# Patient Record
Sex: Male | Born: 1945 | Race: White | Hispanic: No | Marital: Married | Smoking: Former smoker
Health system: Southern US, Community
[De-identification: ages and names within clinical notes are randomized; demographics above are authoritative.]

## PROBLEM LIST (undated history)

## (undated) DIAGNOSIS — K2970 Gastritis, unspecified, without bleeding: Secondary | ICD-10-CM

## (undated) DIAGNOSIS — Z809 Family history of malignant neoplasm, unspecified: Secondary | ICD-10-CM

## (undated) DIAGNOSIS — Q661 Congenital talipes calcaneovarus, unspecified foot: Secondary | ICD-10-CM

## (undated) DIAGNOSIS — A809 Acute poliomyelitis, unspecified: Secondary | ICD-10-CM

## (undated) DIAGNOSIS — G8929 Other chronic pain: Secondary | ICD-10-CM

## (undated) DIAGNOSIS — I10 Essential (primary) hypertension: Secondary | ICD-10-CM

## (undated) DIAGNOSIS — Z8719 Personal history of other diseases of the digestive system: Secondary | ICD-10-CM

## (undated) DIAGNOSIS — M1990 Unspecified osteoarthritis, unspecified site: Secondary | ICD-10-CM

## (undated) DIAGNOSIS — M1612 Unilateral primary osteoarthritis, left hip: Secondary | ICD-10-CM

## (undated) DIAGNOSIS — E039 Hypothyroidism, unspecified: Secondary | ICD-10-CM

## (undated) DIAGNOSIS — Z808 Family history of malignant neoplasm of other organs or systems: Secondary | ICD-10-CM

## (undated) DIAGNOSIS — R7303 Prediabetes: Secondary | ICD-10-CM

## (undated) DIAGNOSIS — K900 Celiac disease: Secondary | ICD-10-CM

## (undated) DIAGNOSIS — Z801 Family history of malignant neoplasm of trachea, bronchus and lung: Secondary | ICD-10-CM

## (undated) DIAGNOSIS — Z860100 Personal history of colon polyps, unspecified: Secondary | ICD-10-CM

## (undated) DIAGNOSIS — M7750 Other enthesopathy of unspecified foot: Secondary | ICD-10-CM

## (undated) DIAGNOSIS — E785 Hyperlipidemia, unspecified: Secondary | ICD-10-CM

## (undated) DIAGNOSIS — K635 Polyp of colon: Secondary | ICD-10-CM

## (undated) HISTORY — DX: Family history of malignant neoplasm of other organs or systems: Z80.8

## (undated) HISTORY — PX: OTHER SURGICAL HISTORY: SHX169

## (undated) HISTORY — DX: Personal history of colonic polyps: Z86.010

## (undated) HISTORY — PX: SHOULDER ARTHROSCOPY: SHX128

## (undated) HISTORY — DX: Personal history of colon polyps, unspecified: Z86.0100

## (undated) HISTORY — PX: ANKLE ARTHROCENTESIS: SUR45

## (undated) HISTORY — DX: Family history of malignant neoplasm, unspecified: Z80.9

## (undated) HISTORY — DX: Family history of malignant neoplasm of trachea, bronchus and lung: Z80.1

## (undated) HISTORY — PX: KNEE ARTHROSCOPY: SHX127

---
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
## 1965-05-28 DIAGNOSIS — R569 Unspecified convulsions: Secondary | ICD-10-CM

## 1965-05-28 HISTORY — DX: Unspecified convulsions: R56.9

## 1968-05-28 HISTORY — PX: ANKLE ARTHROCENTESIS: SUR45

## 1970-05-28 HISTORY — PX: SHOULDER ARTHROSCOPY: SHX128

## 1976-05-28 HISTORY — PX: KNEE ARTHROSCOPY: SUR90

## 1990-05-28 HISTORY — PX: KNEE ARTHROSCOPY: SHX127

## 2005-01-09 ENCOUNTER — Emergency Department: Payer: Self-pay | Admitting: Emergency Medicine

## 2008-07-13 ENCOUNTER — Ambulatory Visit: Payer: Self-pay | Admitting: Gastroenterology

## 2008-07-14 ENCOUNTER — Inpatient Hospital Stay: Payer: Self-pay | Admitting: Gastroenterology

## 2008-11-11 ENCOUNTER — Ambulatory Visit: Payer: Self-pay | Admitting: Endocrinology

## 2008-12-02 ENCOUNTER — Encounter: Payer: Self-pay | Admitting: Endocrinology

## 2009-02-11 ENCOUNTER — Ambulatory Visit: Payer: Self-pay | Admitting: Gastroenterology

## 2010-03-08 ENCOUNTER — Ambulatory Visit: Payer: Self-pay | Admitting: Endocrinology

## 2010-05-01 ENCOUNTER — Ambulatory Visit: Payer: Self-pay | Admitting: Gastroenterology

## 2011-10-01 ENCOUNTER — Ambulatory Visit: Payer: Self-pay | Admitting: Gastroenterology

## 2015-08-15 ENCOUNTER — Other Ambulatory Visit: Payer: Self-pay | Admitting: Orthopedic Surgery

## 2015-08-15 DIAGNOSIS — M1712 Unilateral primary osteoarthritis, left knee: Secondary | ICD-10-CM

## 2015-08-19 ENCOUNTER — Ambulatory Visit
Admission: RE | Admit: 2015-08-19 | Discharge: 2015-08-19 | Disposition: A | Discharge status: Home / Self Care | Payer: Federal, State, Local not specified - PPO | Source: Ambulatory Visit | Attending: Orthopedic Surgery | Admitting: Orthopedic Surgery

## 2015-08-19 DIAGNOSIS — M1712 Unilateral primary osteoarthritis, left knee: Secondary | ICD-10-CM

## 2015-08-31 ENCOUNTER — Encounter
Admission: RE | Admit: 2015-08-31 | Discharge: 2015-08-31 | Disposition: A | Discharge status: Home / Self Care | Payer: Federal, State, Local not specified - PPO | Source: Ambulatory Visit | Attending: Orthopedic Surgery | Admitting: Orthopedic Surgery

## 2015-08-31 DIAGNOSIS — Z01812 Encounter for preprocedural laboratory examination: Secondary | ICD-10-CM | POA: Insufficient documentation

## 2015-08-31 HISTORY — DX: Unspecified osteoarthritis, unspecified site: M19.90

## 2015-08-31 HISTORY — DX: Celiac disease: K90.0

## 2015-08-31 HISTORY — DX: Essential (primary) hypertension: I10

## 2015-08-31 HISTORY — DX: Acute poliomyelitis, unspecified: A80.9

## 2015-08-31 LAB — BASIC METABOLIC PANEL
ANION GAP: 5 (ref 5–15)
BUN: 19 mg/dL (ref 6–20)
CO2: 25 mmol/L (ref 22–32)
Calcium: 9 mg/dL (ref 8.9–10.3)
Chloride: 103 mmol/L (ref 101–111)
Creatinine, Ser: 0.71 mg/dL (ref 0.61–1.24)
GLUCOSE: 89 mg/dL (ref 65–99)
Potassium: 4.1 mmol/L (ref 3.5–5.1)
SODIUM: 133 mmol/L — AB (ref 135–145)

## 2015-08-31 LAB — URINALYSIS COMPLETE WITH MICROSCOPIC (ARMC ONLY)
BACTERIA UA: NONE SEEN
Bilirubin Urine: NEGATIVE
Glucose, UA: NEGATIVE mg/dL
Ketones, ur: NEGATIVE mg/dL
LEUKOCYTES UA: NEGATIVE
Nitrite: NEGATIVE
PH: 6 (ref 5.0–8.0)
PROTEIN: NEGATIVE mg/dL
SQUAMOUS EPITHELIAL / LPF: NONE SEEN
Specific Gravity, Urine: 1.015 (ref 1.005–1.030)

## 2015-08-31 LAB — PROTIME-INR
INR: 1.08
PROTHROMBIN TIME: 14.2 s (ref 11.4–15.0)

## 2015-08-31 LAB — SURGICAL PCR SCREEN
MRSA, PCR: NEGATIVE
Staphylococcus aureus: NEGATIVE

## 2015-08-31 LAB — CBC
HCT: 43.6 % (ref 40.0–52.0)
HEMOGLOBIN: 14.6 g/dL (ref 13.0–18.0)
MCH: 31.5 pg (ref 26.0–34.0)
MCHC: 33.5 g/dL (ref 32.0–36.0)
MCV: 94.1 fL (ref 80.0–100.0)
Platelets: 177 10*3/uL (ref 150–440)
RBC: 4.63 MIL/uL (ref 4.40–5.90)
RDW: 14.1 % (ref 11.5–14.5)
WBC: 5.3 10*3/uL (ref 3.8–10.6)

## 2015-08-31 LAB — SEDIMENTATION RATE: SED RATE: 6 mm/h (ref 0–20)

## 2015-08-31 LAB — APTT: APTT: 29 s (ref 24–36)

## 2015-08-31 NOTE — Patient Instructions (Signed)
  Your procedure is scheduled on: 09/15/15 Thurs Report to Day Surgery.2nd floor medical mall  To find out your arrival time please call [phone] between 1PM - 3PM on 09/14/15 Wed.  Remember: Instructions that are not followed completely may result in serious medical risk, up to and including death, or upon the discretion of your surgeon and anesthesiologist your surgery may need to be rescheduled.    _x___ 1. Do not eat food or drink liquids after midnight. No gum chewing or hard candies.     ____ 2. No Alcohol for 24 hours before or after surgery.   ____ 3. Bring all medications with you on the day of surgery if instructed.    _x___ 4. Notify your doctor if there is any change in your medical condition     (cold, fever, infections).     Do not wear jewelry, make-up, hairpins, clips or nail polish.  Do not wear lotions, powders, or perfumes. You may wear deodorant.  Do not shave 48 hours prior to surgery. Men may shave face and neck.  Do not bring valuables to the hospital.    [facility] is not responsible for any belongings or valuables.               Contacts, dentures or bridgework may not be worn into surgery.  Leave your suitcase in the car. After surgery it may be brought to your room.  For patients admitted to the hospital, discharge time is determined by your                treatment team.   Patients discharged the day of surgery will not be allowed to drive home.   Please read over the following fact sheets that you were given:   MRSA Information   _x___ Take these medicines the morning of surgery with A SIP OF WATER:    1. levothyroxine (SYNTHROID, LEVOTHROID) 100 MCG tablet  2.   3.   4.  5.  6.  ____ Fleet Enema (as directed)   _x___ Use CHG Soap as directed  ____ Use inhalers on the day of surgery  ____ Stop metformin 2 days prior to surgery    ____ Take 1/2 of usual insulin dose the night before surgery and none on the morning of surgery.   __x__  Stop Coumadin/Plavix/aspirin on 1 week to 10 days before surgery  __x__ Stop Anti-inflammatories on 1 week before surgery. May take Tylenol as needed   ____ Stop supplements until after surgery.    ____ Bring C-Pap to the hospital.

## 2015-08-31 NOTE — Pre-Procedure Instructions (Signed)
Curtis Carroll, [place] - 08/30/2015 2:15 PM EDT Formatting of this note may be different from the original. Chief Complaint: Chief Complaint  Patient presents with  . Knee Pain  H&P left knee 09/15/15   Curtis Carroll is a 70 y.o. male who presents today for evaluation of chronic left knee pain. He has had knee pain for 20 years. He has had 2 arthroscopy procedures at [facility]. Pain has been progressively getting worse over the last few years. He describes a constant ache and pain along the medial joint line of the left knee that is increased with activity. By the end of the day his knee will be swollen and his range of motion is limited. Occasionally knee will buckle and give out. He has pain underneath the patella pain with stairs. He gets relief with using a cane wrapping the knee. He does not tolerate NSAIDs well. Patient's pain is 3 out of 10, will be moderate to severe by the end of the day. Patient works for the [employer], pain is interfering with his activities of daily living and work. 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 He denies any sensation of loose body moving throughout the knee.  Past Medical History: Past Medical History  Diagnosis Date  . Hyperlipidemia, unspecified  . Hypertension  . Polio   Past Surgical History: Past Surgical History  Procedure Laterality Date  . Knee surgery Left  . Ankle surgery Right  . Fusion arthrodesis ankle w/arthroscopy   Past Family History: Family History  Problem Relation Age of Onset  . Heart disease Father  . Lung cancer Brother   Medications: Current Outpatient Prescriptions Ordered in Epic  Medication Sig Dispense Refill  . aspirin 81 MG EC tablet Take 81 mg by mouth once daily.  Marland Kitchen atorvastatin (LIPITOR) 10 MG tablet Take 5 mg by mouth once daily.  Marland Kitchen FOLIC ACID/MULTIVIT-MIN/LUTEIN (CENTRUM SILVER ORAL) Take by mouth.  Marland Kitchen lisinopril  (PRINIVIL,ZESTRIL) 20 MG tablet Take 20 mg by mouth once daily. 2  . SYNTHROID 100 mcg tablet Take 100 mcg by mouth once daily. 2  . zolpidem (AMBIEN) 5 MG tablet Take 5 mg by mouth nightly as needed for Sleep.   No current Epic-ordered facility-administered medications on file.   Allergies: Allergies  Allergen Reactions  . Alcohol Unknown  . Codeine Nausea  . Milk Unknown  . Other Diarrhea  Black pepper  . Wheat Unknown    Review of Systems:  A comprehensive 14 point ROS was performed, reviewed by me today, and the pertinent orthopaedic findings are documented in the HPI.  Exam: Visit Vitals  . BP 130/78 (BP Location: Left upper arm, Patient Position: Sitting, BP Cuff Size: Adult)  . Ht 180.3 cm (5\' 11" )  . Wt 94.3 kg (208 lb)  . BMI 29.01 kg/m2   General: Well developed, well nourished 70 y.o. male in no apparent distress. Normal affect. Normal communication. Patient answers questions appropriately. Patient ambulates with mild antalgic component. Ambulates with a cane  HEENT: Head normocephalic, atraumatic, PERRL.   Abdomen: Soft, non tender, non distended, Bowel sounds present.  Heart: Examination of the heart reveals regular, rate, and rhythm. There is no murmur noted on ascultation. There is a normal apical pulse.  Lungs: Lungs are clear to auscultation. There is no wheeze, rhonchi, or crackles. There is normal expansion of bilateral chest walls.  Left lower Extremities: Examination of the left  lower extremity reveals no bony abnormality, no edema, mild effusion and no ecchymosis. There is no valgus or varus abnormality. The patient is non-tender along the lateral joint line, and is mildly tender along the medial joint line. The patient has 5-108 range of motion. There is crepitus with range of motion exercises. The patient has a negative rotational Mcmurray test. There is no retropatellar discomfort. The patient has a painful patella stretch test. The patient has a  negative varus stress test and a negative valgus stress test, in looking for stability. The patient has a negative Lachman's test.  Vascular: The patient has a negative Bevelyn Buckles' test bilaterally. The patient had a normal dorsalis pedis and posterior tibial pulse. There is normal skin warmth. There is normal capillary refill bilaterally.   Neurologic: The patient has a negative straight leg raise. The patient has normal muscle strength testing for the quadriceps, calves, ankle dorsiflexion, ankle plantarflexion, and extensor hallicus longus. The patient has sensation that is intact to light touch. The deep tendon reflexes are normal at the patella and achilles. No clonus is noted.   Imaging: AP lateral and sunrise views of the left knee were reviewed by me in the office today. Impression: Patient has moderate to severe joint space narrowing with minimal space on the medial joint line. There is significant spurring along the medial lateral femoral condyle. There is mild chondrocalcinosis of the lateral compartment. There is no evidence of acute bony abnormality. There is moderate patellofemoral spurring. No evidence of loose body.  Impression: Primary osteoarthritis of left knee [M17.12] Primary osteoarthritis of left knee (primary encounter diagnosis)  Plan:  1. Risks, benefits, complications of a left total knee arthroplasty were discussed with the patient. Patient has agreed and consented to the procedure. He has reviewed total knee information handout. Concerns and questions have been addressed. Surgery is scheduled with Dr. Rudene Christians on 09/15/2015.  This note was generated in part with voice recognition software and I apologize for any typographical errors that were not detected and corrected.  Curtis Carroll Gaines MPA-C    Back to top of Progress Notes Lyndle Herrlich, [place] - 08/30/2015 2:15 PM EDT Review of Systems  Constitutional: Negative.  HENT: Negative.  Eyes: Negative.   Respiratory: Negative.  Cardiovascular: Negative.  Gastrointestinal: Negative.  Endocrine: Negative.  Genitourinary: Negative.  Musculoskeletal: Negative.  Skin: Negative.  Allergic/Immunologic: Positive for food allergies.  Neurological: Negative.  Hematological: Negative.  Psychiatric/Behavioral: Negative.

## 2015-09-02 LAB — URINE CULTURE

## 2015-09-03 NOTE — Pre-Procedure Instructions (Signed)
Urine culture sent to Dr. Rudene Christians for review.  Also asked if he wanted it recollected?

## 2015-09-07 ENCOUNTER — Encounter
Admission: RE | Admit: 2015-09-07 | Discharge: 2015-09-07 | Disposition: A | Discharge status: Home / Self Care | Payer: Federal, State, Local not specified - PPO | Source: Ambulatory Visit | Attending: Orthopedic Surgery | Admitting: Orthopedic Surgery

## 2015-09-07 DIAGNOSIS — Z01812 Encounter for preprocedural laboratory examination: Secondary | ICD-10-CM | POA: Diagnosis present

## 2015-09-07 LAB — TYPE AND SCREEN
ABO/RH(D): O POS
ANTIBODY SCREEN: NEGATIVE

## 2015-09-07 LAB — ABO/RH: ABO/RH(D): O POS

## 2015-09-15 ENCOUNTER — Inpatient Hospital Stay
Admission: RE | Admit: 2015-09-15 | Discharge: 2015-09-18 | DRG: 470 | Disposition: A | Discharge status: Skilled Nursing Facility | Payer: Federal, State, Local not specified - PPO | Source: Ambulatory Visit | Attending: Orthopedic Surgery | Admitting: Orthopedic Surgery

## 2015-09-15 ENCOUNTER — Inpatient Hospital Stay: Payer: Federal, State, Local not specified - PPO

## 2015-09-15 ENCOUNTER — Encounter
Admission: RE | Disposition: A | Discharge status: Skilled Nursing Facility | Payer: Self-pay | Source: Ambulatory Visit | Attending: Orthopedic Surgery

## 2015-09-15 ENCOUNTER — Inpatient Hospital Stay: Payer: Federal, State, Local not specified - PPO | Admitting: Anesthesiology

## 2015-09-15 ENCOUNTER — Encounter: Payer: Self-pay | Admitting: *Deleted

## 2015-09-15 DIAGNOSIS — I10 Essential (primary) hypertension: Secondary | ICD-10-CM | POA: Diagnosis present

## 2015-09-15 DIAGNOSIS — Z8612 Personal history of poliomyelitis: Secondary | ICD-10-CM | POA: Diagnosis not present

## 2015-09-15 DIAGNOSIS — X58XXXA Exposure to other specified factors, initial encounter: Secondary | ICD-10-CM | POA: Diagnosis not present

## 2015-09-15 DIAGNOSIS — D62 Acute posthemorrhagic anemia: Secondary | ICD-10-CM | POA: Diagnosis not present

## 2015-09-15 DIAGNOSIS — M1712 Unilateral primary osteoarthritis, left knee: Principal | ICD-10-CM | POA: Diagnosis present

## 2015-09-15 DIAGNOSIS — S0501XA Injury of conjunctiva and corneal abrasion without foreign body, right eye, initial encounter: Secondary | ICD-10-CM | POA: Diagnosis not present

## 2015-09-15 DIAGNOSIS — G8918 Other acute postprocedural pain: Secondary | ICD-10-CM

## 2015-09-15 DIAGNOSIS — Z7982 Long term (current) use of aspirin: Secondary | ICD-10-CM

## 2015-09-15 DIAGNOSIS — G8929 Other chronic pain: Secondary | ICD-10-CM | POA: Diagnosis present

## 2015-09-15 DIAGNOSIS — Z79899 Other long term (current) drug therapy: Secondary | ICD-10-CM | POA: Diagnosis not present

## 2015-09-15 DIAGNOSIS — K900 Celiac disease: Secondary | ICD-10-CM | POA: Diagnosis present

## 2015-09-15 DIAGNOSIS — E785 Hyperlipidemia, unspecified: Secondary | ICD-10-CM | POA: Diagnosis present

## 2015-09-15 DIAGNOSIS — Z801 Family history of malignant neoplasm of trachea, bronchus and lung: Secondary | ICD-10-CM

## 2015-09-15 DIAGNOSIS — Y92234 Operating room of hospital as the place of occurrence of the external cause: Secondary | ICD-10-CM | POA: Diagnosis not present

## 2015-09-15 DIAGNOSIS — Z8249 Family history of ischemic heart disease and other diseases of the circulatory system: Secondary | ICD-10-CM

## 2015-09-15 HISTORY — PX: TOTAL KNEE ARTHROPLASTY: SHX125

## 2015-09-15 LAB — CREATININE, SERUM: Creatinine, Ser: 0.72 mg/dL (ref 0.61–1.24)

## 2015-09-15 LAB — CBC
HEMATOCRIT: 39.5 % — AB (ref 40.0–52.0)
Hemoglobin: 13.3 g/dL (ref 13.0–18.0)
MCH: 31.2 pg (ref 26.0–34.0)
MCHC: 33.6 g/dL (ref 32.0–36.0)
MCV: 92.8 fL (ref 80.0–100.0)
PLATELETS: 142 10*3/uL — AB (ref 150–440)
RBC: 4.26 MIL/uL — ABNORMAL LOW (ref 4.40–5.90)
RDW: 14.4 % (ref 11.5–14.5)
WBC: 4.4 10*3/uL (ref 3.8–10.6)

## 2015-09-15 SURGERY — ARTHROPLASTY, KNEE, TOTAL
Anesthesia: Spinal | Site: Knee | Laterality: Left | Wound class: Clean

## 2015-09-15 MED ORDER — ONDANSETRON HCL 4 MG PO TABS: 4.0000 mg | ORAL_TABLET | Freq: Four times a day (QID) | ORAL | Status: DC | PRN

## 2015-09-15 MED ORDER — TRANEXAMIC ACID 1000 MG/10ML IV SOLN
1000.0000 mg | INTRAVENOUS | Status: DC
  Filled 2015-09-15: qty 10

## 2015-09-15 MED ORDER — METOCLOPRAMIDE HCL 5 MG/ML IJ SOLN: 5.0000 mg | Freq: Three times a day (TID) | INTRAMUSCULAR | Status: DC | PRN

## 2015-09-15 MED ORDER — ATORVASTATIN CALCIUM 10 MG PO TABS
5.0000 mg | ORAL_TABLET | Freq: Every day | ORAL | Status: DC
  Administered 2015-09-15 – 2015-09-17 (×3): 5 mg via ORAL
  Filled 2015-09-15 (×3): qty 1

## 2015-09-15 MED ORDER — DOCUSATE SODIUM 100 MG PO CAPS
100.0000 mg | ORAL_CAPSULE | Freq: Two times a day (BID) | ORAL | Status: DC
  Administered 2015-09-15 – 2015-09-18 (×7): 100 mg via ORAL
  Filled 2015-09-15 (×7): qty 1

## 2015-09-15 MED ORDER — METOCLOPRAMIDE HCL 10 MG PO TABS: 5.0000 mg | ORAL_TABLET | Freq: Three times a day (TID) | ORAL | Status: DC | PRN

## 2015-09-15 MED ORDER — LACTATED RINGERS IV SOLN
INTRAVENOUS | Status: DC | PRN
Start: 2015-09-15 — End: 2015-09-15
  Administered 2015-09-15: 07:00:00 via INTRAVENOUS

## 2015-09-15 MED ORDER — NEOMYCIN-POLYMYXIN B GU 40-200000 IR SOLN
Status: AC
  Filled 2015-09-15: qty 20

## 2015-09-15 MED ORDER — ZOLPIDEM TARTRATE 5 MG PO TABS
5.0000 mg | ORAL_TABLET | Freq: Every evening | ORAL | Status: DC | PRN
  Administered 2015-09-15: 5 mg via ORAL
  Filled 2015-09-15: qty 1

## 2015-09-15 MED ORDER — ONDANSETRON HCL 4 MG/2ML IJ SOLN: 4.0000 mg | Freq: Four times a day (QID) | INTRAMUSCULAR | Status: DC | PRN

## 2015-09-15 MED ORDER — EPHEDRINE SULFATE 50 MG/ML IJ SOLN
INTRAMUSCULAR | Status: DC | PRN
  Administered 2015-09-15: 10 mg via INTRAVENOUS

## 2015-09-15 MED ORDER — ACETAMINOPHEN 650 MG RE SUPP: 650.0000 mg | Freq: Four times a day (QID) | RECTAL | Status: DC | PRN

## 2015-09-15 MED ORDER — ENOXAPARIN SODIUM 30 MG/0.3ML ~~LOC~~ SOLN
30.0000 mg | Freq: Two times a day (BID) | SUBCUTANEOUS | Status: DC
  Administered 2015-09-16 – 2015-09-18 (×5): 30 mg via SUBCUTANEOUS
  Filled 2015-09-15 (×5): qty 0.3

## 2015-09-15 MED ORDER — OXYCODONE HCL 5 MG PO TABS: 5.0000 mg | ORAL_TABLET | Freq: Once | ORAL | Status: DC | PRN

## 2015-09-15 MED ORDER — ONDANSETRON HCL 4 MG/2ML IJ SOLN
INTRAMUSCULAR | Status: DC | PRN
  Administered 2015-09-15: 4 mg via INTRAVENOUS

## 2015-09-15 MED ORDER — CEFAZOLIN SODIUM-DEXTROSE 2-4 GM/100ML-% IV SOLN
INTRAVENOUS | Status: AC
Start: 2015-09-15 — End: 2015-09-15
  Administered 2015-09-15: 2 g via INTRAVENOUS
  Filled 2015-09-15: qty 100

## 2015-09-15 MED ORDER — BUPIVACAINE HCL (PF) 0.5 % IJ SOLN
INTRAMUSCULAR | Status: DC | PRN
  Administered 2015-09-15: 3 mL

## 2015-09-15 MED ORDER — ACETAMINOPHEN 325 MG PO TABS
650.0000 mg | ORAL_TABLET | Freq: Four times a day (QID) | ORAL | Status: DC | PRN
  Filled 2015-09-15: qty 2

## 2015-09-15 MED ORDER — MORPHINE SULFATE (PF) 10 MG/ML IV SOLN
INTRAVENOUS | Status: AC
  Filled 2015-09-15: qty 1

## 2015-09-15 MED ORDER — SODIUM CHLORIDE 0.9 % IV SOLN
INTRAVENOUS | Status: DC | PRN
  Administered 2015-09-15: 60 mL

## 2015-09-15 MED ORDER — TRANEXAMIC ACID 1000 MG/10ML IV SOLN
1000.0000 mg | INTRAVENOUS | Status: DC | PRN
  Administered 2015-09-15: 1000 mg via INTRAVENOUS

## 2015-09-15 MED ORDER — FAMOTIDINE 20 MG PO TABS
20.0000 mg | ORAL_TABLET | Freq: Once | ORAL | Status: AC
  Administered 2015-09-15: 20 mg via ORAL

## 2015-09-15 MED ORDER — BISACODYL 10 MG RE SUPP
10.0000 mg | Freq: Every day | RECTAL | Status: DC | PRN
  Administered 2015-09-18: 10 mg via RECTAL
  Filled 2015-09-15: qty 1

## 2015-09-15 MED ORDER — PROPOFOL 500 MG/50ML IV EMUL
INTRAVENOUS | Status: DC | PRN
  Administered 2015-09-15: 50 ug/kg/min via INTRAVENOUS

## 2015-09-15 MED ORDER — MORPHINE SULFATE (PF) 2 MG/ML IV SOLN: 2.0000 mg | INTRAVENOUS | Status: DC | PRN

## 2015-09-15 MED ORDER — METHOCARBAMOL 500 MG PO TABS: 500.0000 mg | ORAL_TABLET | Freq: Four times a day (QID) | ORAL | Status: DC | PRN

## 2015-09-15 MED ORDER — MAGNESIUM HYDROXIDE 400 MG/5ML PO SUSP
30.0000 mL | Freq: Every day | ORAL | Status: DC | PRN
  Administered 2015-09-16 – 2015-09-17 (×2): 30 mL via ORAL
  Filled 2015-09-15 (×2): qty 30

## 2015-09-15 MED ORDER — LACTATED RINGERS IV SOLN
INTRAVENOUS | Status: DC
  Administered 2015-09-15: 06:00:00 via INTRAVENOUS

## 2015-09-15 MED ORDER — MAGNESIUM CITRATE PO SOLN: 1.0000 | Freq: Once | ORAL | Status: DC | PRN

## 2015-09-15 MED ORDER — MENTHOL 3 MG MT LOZG: 1.0000 | LOZENGE | OROMUCOSAL | Status: DC | PRN

## 2015-09-15 MED ORDER — KETOROLAC TROMETHAMINE 30 MG/ML IJ SOLN
INTRAMUSCULAR | Status: DC | PRN
  Administered 2015-09-15: 30 mg

## 2015-09-15 MED ORDER — SODIUM CHLORIDE 0.9 % IJ SOLN
INTRAMUSCULAR | Status: AC
  Filled 2015-09-15: qty 100

## 2015-09-15 MED ORDER — LEVOTHYROXINE SODIUM 100 MCG PO TABS
100.0000 ug | ORAL_TABLET | Freq: Every day | ORAL | Status: DC
  Administered 2015-09-16 – 2015-09-18 (×3): 100 ug via ORAL
  Filled 2015-09-15 (×3): qty 1

## 2015-09-15 MED ORDER — CLINDAMYCIN PHOSPHATE 900 MG/50ML IV SOLN
900.0000 mg | Freq: Four times a day (QID) | INTRAVENOUS | Status: AC
  Administered 2015-09-15 (×3): 900 mg via INTRAVENOUS
  Filled 2015-09-15 (×3): qty 50

## 2015-09-15 MED ORDER — ADULT MULTIVITAMIN W/MINERALS CH
1.0000 | ORAL_TABLET | Freq: Every day | ORAL | Status: DC
  Administered 2015-09-15: 1 via ORAL
  Filled 2015-09-15 (×3): qty 1

## 2015-09-15 MED ORDER — CEFAZOLIN SODIUM-DEXTROSE 2-4 GM/100ML-% IV SOLN: 2.0000 g | Freq: Once | INTRAVENOUS | Status: DC

## 2015-09-15 MED ORDER — OXYCODONE HCL 5 MG/5ML PO SOLN: 5.0000 mg | Freq: Once | ORAL | Status: DC | PRN

## 2015-09-15 MED ORDER — BUPIVACAINE-EPINEPHRINE (PF) 0.25% -1:200000 IJ SOLN
INTRAMUSCULAR | Status: DC | PRN
  Administered 2015-09-15: 30 mL

## 2015-09-15 MED ORDER — MIDAZOLAM HCL 5 MG/5ML IJ SOLN
INTRAMUSCULAR | Status: DC | PRN
  Administered 2015-09-15: 2 mg via INTRAVENOUS

## 2015-09-15 MED ORDER — BUPIVACAINE-EPINEPHRINE (PF) 0.25% -1:200000 IJ SOLN
INTRAMUSCULAR | Status: AC
  Filled 2015-09-15: qty 30

## 2015-09-15 MED ORDER — NEOMYCIN-POLYMYXIN B GU 40-200000 IR SOLN
Status: DC | PRN
  Administered 2015-09-15: 16 mL

## 2015-09-15 MED ORDER — DEXTROSE 5 % IV SOLN: 500.0000 mg | Freq: Four times a day (QID) | INTRAVENOUS | Status: DC | PRN

## 2015-09-15 MED ORDER — PHENOL 1.4 % MT LIQD: 1.0000 | OROMUCOSAL | Status: DC | PRN

## 2015-09-15 MED ORDER — CENTRUM PO CHEW: 1.0000 | CHEWABLE_TABLET | Freq: Every day | ORAL | Status: DC

## 2015-09-15 MED ORDER — FENTANYL CITRATE (PF) 100 MCG/2ML IJ SOLN: 25.0000 ug | INTRAMUSCULAR | Status: DC | PRN

## 2015-09-15 MED ORDER — FENTANYL CITRATE (PF) 100 MCG/2ML IJ SOLN
INTRAMUSCULAR | Status: DC | PRN
  Administered 2015-09-15 (×2): 50 ug via INTRAVENOUS

## 2015-09-15 MED ORDER — ASPIRIN 325 MG PO TABS
650.0000 mg | ORAL_TABLET | Freq: Every day | ORAL | Status: DC
  Filled 2015-09-15: qty 2

## 2015-09-15 MED ORDER — FAMOTIDINE 20 MG PO TABS
ORAL_TABLET | ORAL | Status: AC
  Filled 2015-09-15: qty 1

## 2015-09-15 MED ORDER — ERYTHROMYCIN 5 MG/GM OP OINT
TOPICAL_OINTMENT | Freq: Four times a day (QID) | OPHTHALMIC | Status: DC
  Administered 2015-09-15 – 2015-09-16 (×4): 1 via OPHTHALMIC
  Filled 2015-09-15: qty 3.5

## 2015-09-15 MED ORDER — NAPHAZOLINE-GLYCERIN 0.012-0.2 % OP SOLN: 1.0000 [drp] | Freq: Four times a day (QID) | OPHTHALMIC | Status: DC | PRN

## 2015-09-15 MED ORDER — SODIUM CHLORIDE 0.9 % IV SOLN
INTRAVENOUS | Status: DC
  Administered 2015-09-15: 13:00:00 via INTRAVENOUS

## 2015-09-15 MED ORDER — OXYCODONE HCL 5 MG PO TABS
5.0000 mg | ORAL_TABLET | ORAL | Status: DC | PRN
  Administered 2015-09-15 – 2015-09-16 (×7): 5 mg via ORAL
  Administered 2015-09-17: 10 mg via ORAL
  Administered 2015-09-17: 5 mg via ORAL
  Administered 2015-09-17: 10 mg via ORAL
  Administered 2015-09-17 – 2015-09-18 (×3): 5 mg via ORAL
  Administered 2015-09-18: 10 mg via ORAL
  Administered 2015-09-18: 5 mg via ORAL
  Filled 2015-09-15 (×2): qty 2
  Filled 2015-09-15 (×2): qty 1
  Filled 2015-09-15 (×2): qty 2
  Filled 2015-09-15 (×3): qty 1
  Filled 2015-09-15: qty 2
  Filled 2015-09-15 (×3): qty 1
  Filled 2015-09-15: qty 2
  Filled 2015-09-15 (×3): qty 1

## 2015-09-15 MED ORDER — LISINOPRIL 10 MG PO TABS
10.0000 mg | ORAL_TABLET | Freq: Every day | ORAL | Status: DC
  Administered 2015-09-15 – 2015-09-17 (×3): 10 mg via ORAL
  Filled 2015-09-15 (×3): qty 1

## 2015-09-15 MED ORDER — BUPIVACAINE LIPOSOME 1.3 % IJ SUSP
INTRAMUSCULAR | Status: AC
  Filled 2015-09-15: qty 20

## 2015-09-15 MED ORDER — MORPHINE SULFATE 10 MG/ML IJ SOLN
INTRAMUSCULAR | Status: DC | PRN
  Administered 2015-09-15: 10 mg

## 2015-09-15 SURGICAL SUPPLY — 58 items
BANDAGE ACE 6X5 VEL STRL LF (GAUZE/BANDAGES/DRESSINGS) ×3 IMPLANT
BLADE SAW 1 (BLADE) ×3 IMPLANT
BLOCK CUTTING TIBIAL 4 LT CT (MISCELLANEOUS) IMPLANT
BLOCK CUTTING TIBIAL 4 MED (MISCELLANEOUS) IMPLANT
CANISTER SUCT 1200ML W/VALVE (MISCELLANEOUS) ×3 IMPLANT
CANISTER SUCT 3000ML (MISCELLANEOUS) ×6 IMPLANT
CAPT KNEE TOTAL 3 ×3 IMPLANT
CATH FOL LEG HOLDER (MISCELLANEOUS) ×3 IMPLANT
CATH TRAY METER 16FR LF (MISCELLANEOUS) ×3 IMPLANT
CEMENT HV SMART SET (Cement) ×6 IMPLANT
CHLORAPREP W/TINT 26ML (MISCELLANEOUS) ×6 IMPLANT
COOLER POLAR GLACIER W/PUMP (MISCELLANEOUS) ×3 IMPLANT
DRAPE INCISE IOBAN 66X45 STRL (DRAPES) ×6 IMPLANT
DRAPE SHEET LG 3/4 BI-LAMINATE (DRAPES) ×6 IMPLANT
DRSG MEPITEL 4X7.2 (GAUZE/BANDAGES/DRESSINGS) ×3 IMPLANT
DRSG VAC ATS LRG SENSATRAC (GAUZE/BANDAGES/DRESSINGS) ×3 IMPLANT
ELECT CAUTERY BLADE 6.4 (BLADE) ×3 IMPLANT
ELECT REM PT RETURN 9FT ADLT (ELECTROSURGICAL) ×3
ELECTRODE REM PT RTRN 9FT ADLT (ELECTROSURGICAL) ×1 IMPLANT
GAUZE PETRO XEROFOAM 1X8 (MISCELLANEOUS) ×3 IMPLANT
GAUZE SPONGE 4X4 12PLY STRL (GAUZE/BANDAGES/DRESSINGS) ×3 IMPLANT
GLOVE BIOGEL PI IND STRL 9 (GLOVE) ×1 IMPLANT
GLOVE BIOGEL PI INDICATOR 9 (GLOVE) ×2
GLOVE INDICATOR 8.0 STRL GRN (GLOVE) ×3 IMPLANT
GLOVE SURG ORTHO 8.0 STRL STRW (GLOVE) ×3 IMPLANT
GLOVE SURG ORTHO 9.0 STRL STRW (GLOVE) ×3 IMPLANT
GOWN STRL REUS W/ TWL LRG LVL3 (GOWN DISPOSABLE) ×1 IMPLANT
GOWN STRL REUS W/ TWL XL LVL3 (GOWN DISPOSABLE) ×1 IMPLANT
GOWN STRL REUS W/TWL LRG LVL3 (GOWN DISPOSABLE) ×2
GOWN STRL REUS W/TWL XL LVL3 (GOWN DISPOSABLE) ×2
GOWN SURG XXL (GOWNS) ×3 IMPLANT
HANDPIECE SUCTION TUBG SURGILV (MISCELLANEOUS) ×3 IMPLANT
HOOD PEEL AWAY FLYTE STAYCOOL (MISCELLANEOUS) ×6 IMPLANT
IMMBOLIZER KNEE 19 BLUE UNIV (SOFTGOODS) ×3 IMPLANT
KNEE MEDACTA TIBIAL/FEMORAL BL (Knees) ×3 IMPLANT
KNIFE SCULPS 14X20 (INSTRUMENTS) ×3 IMPLANT
NDL SAFETY 18GX1.5 (NEEDLE) ×3 IMPLANT
NEEDLE SPNL 18GX3.5 QUINCKE PK (NEEDLE) ×3 IMPLANT
NEEDLE SPNL 20GX3.5 QUINCKE YW (NEEDLE) ×3 IMPLANT
NS IRRIG 1000ML POUR BTL (IV SOLUTION) ×3 IMPLANT
PACK TOTAL KNEE (MISCELLANEOUS) ×3 IMPLANT
PAD NEG PRESSURE SENSATRAC (MISCELLANEOUS) ×3 IMPLANT
PAD WRAPON POLAR KNEE (MISCELLANEOUS) ×1 IMPLANT
SOL .9 NS 3000ML IRR  AL (IV SOLUTION) ×2
SOL .9 NS 3000ML IRR UROMATIC (IV SOLUTION) ×1 IMPLANT
STAPLER SKIN PROX 35W (STAPLE) ×3 IMPLANT
STRAP SAFETY BODY (MISCELLANEOUS) ×3 IMPLANT
SUCTION FRAZIER HANDLE 10FR (MISCELLANEOUS) ×2
SUCTION TUBE FRAZIER 10FR DISP (MISCELLANEOUS) ×1 IMPLANT
SUT DVC 2 QUILL PDO  T11 36X36 (SUTURE) ×2
SUT DVC 2 QUILL PDO T11 36X36 (SUTURE) ×1 IMPLANT
SUT DVC QUILL MONODERM 30X30 (SUTURE) ×3 IMPLANT
SYR 20CC LL (SYRINGE) ×3 IMPLANT
SYR 50ML LL SCALE MARK (SYRINGE) ×6 IMPLANT
TIBIAL BONE MODEL LEFT (MISCELLANEOUS) IMPLANT
TOWEL OR 17X26 4PK STRL BLUE (TOWEL DISPOSABLE) ×3 IMPLANT
TOWER CARTRIDGE SMART MIX (DISPOSABLE) ×3 IMPLANT
WRAPON POLAR PAD KNEE (MISCELLANEOUS) ×3

## 2015-09-15 NOTE — OR Nursing (Signed)
Pt has used restroom prior to coming back to OR

## 2015-09-15 NOTE — Transfer of Care (Signed)
Immediate Anesthesia Transfer of Care Note  Patient: Curtis Carroll  Procedure(s) Performed: Procedure(s): TOTAL KNEE ARTHROPLASTY (Left)  Patient Location: PACU  Anesthesia Type:Spinal  Level of Consciousness: awake, alert  and oriented  Airway & Oxygen Therapy: Patient Spontanous Breathing  Post-op Assessment: Report given to RN  Post vital signs: Reviewed and stable  Last Vitals:  Filed Vitals:   09/15/15 0945 09/15/15 0946  BP: 116/69 116/69  Pulse: 70 69  Temp: 36.5 C 37 C  Resp: 16 16    Complications: No apparent anesthesia complications

## 2015-09-15 NOTE — Consult Note (Signed)
  Subjective: OD uncomfortable since surgery. Improving.  Objective: Vital signs in last 24 hours: Temp:  [96.9 F (36.1 C)-98.6 F (37 C)] 98 F (36.7 C) (04/20 1517) Pulse Rate:  [49-70] 59 (04/20 1517) Resp:  [13-18] 18 (04/20 1443) BP: (89-146)/(56-78) 102/56 mmHg (04/20 1517) SpO2:  [95 %-100 %] 97 % (04/20 1517) Weight:  [93.895 kg (207 lb)] 93.895 kg (207 lb) (04/20 0552)    BCVA  20/25   OD   20/25 OS Mild abrasion inferior cornea OD   Recent Labs  09/15/15 1105  WBC 4.4  HGB 13.3  HCT 39.5*  CREATININE 0.72    Studies/Results: Dg Knee 1-2 Views Left  09/15/2015  CLINICAL DATA:  Status post total knee replacement EXAM: LEFT KNEE - 1-2 VIEW COMPARISON:  None. FINDINGS: LEFT total knee arthroplasty. Surgical drain and soft tissue gas noted. Skin staples noted. No fracture or dislocation. IMPRESSION: No complication following LEFT knee total arthroplasty. Electronically Signed   By: Suzy Bouchard M.D.   On: 09/15/2015 10:22    Medications:   Assessment/Plan: Erthyromycin Ung TID OD  For 3 days. F/U [facility] if discomfort persists upon D/C.  LOS: 0 days   Dare Spillman LOUIS 4/20/20175:33 PM

## 2015-09-15 NOTE — Progress Notes (Signed)
Patient complaining of right eye irritation, "like there's a piece of hair in the eye." Dr Rudene Christians notified, orders for eye drops placed.

## 2015-09-15 NOTE — Care Management (Signed)
Patient uses [facility] [address] [place] [phone] for medications. List of home health agencies left at bedside. RNCM will follow up with patient tomorrow.

## 2015-09-15 NOTE — Anesthesia Procedure Notes (Addendum)
Performed by: UVQQUIV, DAVID Oxygen Delivery Method: Simple face mask   Spinal Patient location during procedure: OR Start time: 09/15/2015 7:22 AM End time: 09/15/2015 7:27 AM Staffing Anesthesiologist: Katy Fitch K Performed by: anesthesiologist  Preanesthetic Checklist Completed: patient identified, site marked, surgical consent, pre-op evaluation, timeout performed, IV checked, risks and benefits discussed and monitors and equipment checked Spinal Block Patient position: sitting Prep: Betadine Patient monitoring: heart rate, continuous pulse ox, blood pressure and cardiac monitor Approach: midline Location: L3-4 Injection technique: single-shot Needle Needle type: Whitacre and Introducer  Needle gauge: 24 G Needle length: 12.7 cm Assessment Sensory level: T10 Additional Notes Negative paresthesia. Negative blood return. Positive free-flowing CSF. Expiration date of kit checked and confirmed. Patient tolerated procedure well, without complications.

## 2015-09-15 NOTE — Anesthesia Preprocedure Evaluation (Signed)
Anesthesia Evaluation  Patient identified by MRN, date of birth, ID band Patient awake    Reviewed: Allergy & Precautions, H&P , NPO status , Patient's Chart, lab work & pertinent test results  History of Anesthesia Complications Negative for: history of anesthetic complications  Airway Mallampati: III  TM Distance: >3 FB Neck ROM: limited    Dental  (+) Poor Dentition   Pulmonary neg shortness of breath, Current Smoker,    Pulmonary exam normal breath sounds clear to auscultation       Cardiovascular Exercise Tolerance: Good hypertension, (-) angina(-) Past MI and (-) DOE Normal cardiovascular exam Rhythm:regular Rate:Normal     Neuro/Psych negative neurological ROS  negative psych ROS   GI/Hepatic negative GI ROS, Neg liver ROS, neg GERD  ,  Endo/Other  negative endocrine ROS  Renal/GU negative Renal ROS  negative genitourinary   Musculoskeletal  (+) Arthritis ,   Abdominal   Peds  Hematology negative hematology ROS (+)   Anesthesia Other Findings Past Medical History:   Celiac disease                                               Polio                                                        Hypertension                                                 Arthritis                                                   Past Surgical History:   KNEE ARTHROSCOPY                                Left              ANKLE ARTHROCENTESIS                            Right              SHOULDER ARTHROSCOPY                            Right              pins in toes                                    Right             BMI    Body Mass Index   28.88 kg/m 2    Patient has had a spinal in the past without problems.  Reproductive/Obstetrics negative OB ROS  Anesthesia Physical Anesthesia Plan  ASA: III  Anesthesia Plan: Spinal   Post-op Pain Management:    Induction:    Airway Management Planned:   Additional Equipment:   Intra-op Plan:   Post-operative Plan:   Informed Consent: I have reviewed the patients History and Physical, chart, labs and discussed the procedure including the risks, benefits and alternatives for the proposed anesthesia with the patient or authorized representative who has indicated his/her understanding and acceptance.   Dental Advisory Given  Plan Discussed with: Anesthesiologist, CRNA and Surgeon  Anesthesia Plan Comments:         Anesthesia Quick Evaluation

## 2015-09-15 NOTE — H&P (Signed)
Reviewed paper H+P, will be scanned into chart. No changes noted.  

## 2015-09-15 NOTE — Op Note (Signed)
09/15/2015  9:45 AM  PATIENT:  Curtis Carroll  70 y.o. male  PRE-OPERATIVE DIAGNOSIS:  OSTEOARTHRITIS LEFT KNEE, CHRONIC LEFT KNEE PAIN  POST-OPERATIVE DIAGNOSIS:  OSTEOARTHRITIS LEFT KNEE, CHRONIC LEFT KNEE PAIN  PROCEDURE:  Procedure(s): TOTAL KNEE ARTHROPLASTY (Left)  SURGEON: Laurene Footman, MD  ASSISTANTS: Rachelle Hora [facility]  ANESTHESIA:   spinal  EBL:  Total I/O In: -  Out: 150 [Blood:150]  BLOOD ADMINISTERED:none  DRAINS: Wound VAC over incision   LOCAL MEDICATIONS USED:  MARCAINE    and OTHER Exparel morphine and Toradol  SPECIMEN:  Source of Specimen:  Cut ends of bone  DISPOSITION OF SPECIMEN:  PATHOLOGY  COUNTS:  YES  TOURNIQUET:  * Missing tourniquet times found for documented tourniquets in log:  [id number] * 37 minutes at 300 mmHg  IMPLANTS: Medacta GMK sphere left 5 femur 4 tibia with 10 mm insert and 3 patella all components cemented  DICTATION: .Dragon Dictation patient brought the operating room and after adequate anesthesia was obtained the left leg was prepped and draped in sterile fashion with tourniquet applied. After patient identification and timeout procedures were completed, incision was made incorporating a previous medial incision over the knee that was extended proximally and distally towards the midline. Minimal undermining the skin was attempted to try to minimize risk of flap damage with a thick flap elevated the capsule was exposed and a medial arthrotomy performed inspection revealed eburnated bone in both compartments as well as patellofemoral joint with extensive spurring the anterior cruciate ligament and fat pad were excised and proximal tibia was exposed for the [place] cutting block and proximal tibia cut carried out. Next the distal femur was exposed and the femoral cut using the [place] cutting guide from the my knee system performed. The 4-in-1 cutting guide was applied and anterior posterior chamfer cuts made at this point the posterior  horns of menisci could be excised along with some posterior bone. From the tibia. After adequate exposure the 4 tibia baseplate trial was placed pinned into position and prepared with drilling proximally for short stem and keel punch the 5 femur was placed and distal femoral drill holes made with a 10 mm insert giving full extension with a 20 flexion contracture present*the case. The trials were removed and the trochlear groove cut made on the distal femur. The patella was cut using the patellar cutting guide after measurements made for thickness and after drill holes were made this sized to a size 3. The above local anesthetic was then infiltrated in the para-articular tissues. At the time of the completion of the bone cuts before tibial keel preparation tourniquet was raised the bony surfaces were thoroughly irrigated and dried. The tibial component was cemented in place first followed by the femoral component and the 10 mm trial the knee is held in extension with the patellar button clamped in place. The final insert was then placed and set screw tightened with the torque screwdriver. Tourniquet was let down and the knee irrigated with a dilute solution followed by pulse lavage. Patella tracked well with no touch technique there is good stability there is valgus and in flexion with full extension passively and flexion to 120. The arthrotomy was repaired using a heavy Quill followed by to a Quill subcutaneously and skin staples. A incisional wound VAC applied with Mepitel overlying the staples black foam and then the wound VAC this was Then covered with a tile and the care applied  PLAN OF CARE: Admit to inpatient  PATIENT DISPOSITION:  PACU - hemodynamically stable.

## 2015-09-15 NOTE — OR Nursing (Signed)
Met resistance when attempting to place catheter, 16 F double lumen. Coude 77 F went in without resistance however no urine returned. Dr Rudene Christians in room at time and stated to proceed, leave catheter in and monitor output, if no urine throughout case will get an urology consult post operatively.

## 2015-09-15 NOTE — Evaluation (Signed)
Physical Therapy Evaluation Patient Details Name: Curtis Carroll MRN: [id number] DOB: 1945-12-26 Today's Date: 09/15/2015   History of Present Illness  Pt is a 70 y.o. M who received L TKA on 4/20. Pt has hx of polio, HTN, OA and celiac disease.   Clinical Impression  Pt is a pleasant 70 y.o. M with L TKA POD #0. Prior to admission, pt performed all ADLs independently. Pt occasionally ambulated with SPC prior to surgery due to L knee pain. Pt expressed R LE is typically weaker than L d/t polio. Pt alert and oriented during evaluation. Pt demonstrated good B UE and LE strength. Pt able to perform bed mobility with min assist of L LE. Pt able to transfer from EOB using RW and mod assist, requiring increased time to maintain weight bearing on L LE. Pt able to ambulate approx 3 ft in room using RW and mod assist. Pt performed supine there-ex on L LE including SLR x10 w/o assist, indicating no need for knee immobilizer. Pt demonstrates deficits in mobility, strength, and ROM. Pt would benefit from further skilled therapy to address deficits and return to PLOF; recommend pt receive home health PT after discharge from acute hospitalization.     Follow Up Recommendations Home health PT    Equipment Recommendations  Rolling walker with 5" wheels    Recommendations for Other Services       Precautions / Restrictions Precautions Precautions: Fall Restrictions Weight Bearing Restrictions: Yes LLE Weight Bearing: Weight bearing as tolerated      Mobility  Bed Mobility Overal bed mobility: Needs Assistance Bed Mobility: Supine to Sit     Supine to sit: Min assist     General bed mobility comments: Pt able to perform bed mobility with min assist and use of bed railings. Pt required assist with moving L LE into adduction. Pt provided cues regarding hand placement during mobility.   Transfers Overall transfer level: Needs assistance Equipment used: Rolling walker (2 wheeled) Transfers:  Sit to/from Stand Sit to Stand: Mod assist         General transfer comment: Pt able to transfer from EOB using RW and mod assist. Pt required increased time to rise from bed and to maintain WBAT once standing. Pt educated on WB status prior to standing. Pt instructed to prop foot out prior to sitting.   Ambulation/Gait Ambulation/Gait assistance: Mod assist Ambulation Distance (Feet): 3 Feet Assistive device: Rolling walker (2 wheeled) Gait Pattern/deviations: Step-to pattern;Shuffle Gait velocity: slow   General Gait Details: Pt able to ambulate in room 3 ft using RW and mod assist. Pt demonstrated step-to shuffle gait with short steps and decreased weight-bearing on L LE. Pt provided heavy cues regarding foot and walker placement. Pt required heavy reliance on B UE with RW during ambulation.   Stairs            Wheelchair Mobility    Modified Rankin (Stroke Patients Only)       Balance Overall balance assessment: Needs assistance Sitting-balance support: Bilateral upper extremity supported;Feet supported Sitting balance-Leahy Scale: Good Sitting balance - Comments: Pt able to perform sitting balance with use of B UE and feet supported. Pt able to maintain balance for 1 min+.    Standing balance support: Bilateral upper extremity supported Standing balance-Leahy Scale: Fair Standing balance comment: Pt able to maintain standing balance with RW and min assist. Pt demonstrates decreased WB on L LE and increased use of B UE on walker when standing.  Pertinent Vitals/Pain Pain Assessment: 0-10 Pain Score: 3  Pain Location: L Knee Pain Descriptors / Indicators: Operative site guarding Pain Intervention(s): Premedicated before session    Home Living Family/patient expects to be discharged to:: Private residence Living Arrangements: Spouse/significant other Available Help at Discharge: Family Type of Home: House Home Access:  Stairs to enter Entrance Stairs-Rails: None Technical brewer of Steps: 3 Home Layout: One level Home Equipment: Cane - single point Additional Comments: Pt lives with wife who is around t/o the day to help assist.    Prior Function Level of Independence: Independent         Comments: Pt peformed all ADLs independently. Just prior to surgery, pt used SPC for ambulation due to knee pain.      Hand Dominance        Extremity/Trunk Assessment   Upper Extremity Assessment: RUE deficits/detail;LUE deficits/detail RUE Deficits / Details: R UE grossly 5/5 strength     LUE Deficits / Details: L UE grossly 5/5 strength   Lower Extremity Assessment: RLE deficits/detail;LLE deficits/detail RLE Deficits / Details: R LE grossly 4+/5 strength (Pt expressed R LE weaker d/t polio) LLE Deficits / Details: L LE grossly 4-/5 strength     Communication   Communication: No difficulties  Cognition Arousal/Alertness: Awake/alert Behavior During Therapy: WFL for tasks assessed/performed Overall Cognitive Status: Within Functional Limits for tasks assessed                      General Comments      Exercises Total Joint Exercises Goniometric ROM: L LE AAROM: 3 - 81 degrees  Other Exercises Other Exercises: Pt able to perform supine ther-ex on L LE including SLR and ankle pumps w/no assist, hip abd and SAQ with min assist. All ther-ex performed x10 reps.       Assessment/Plan    PT Assessment Patient needs continued PT services  PT Diagnosis Difficulty walking;Abnormality of gait;Acute pain   PT Problem List Decreased strength;Decreased range of motion;Decreased activity tolerance;Decreased mobility;Decreased knowledge of use of DME  PT Treatment Interventions DME instruction;Gait training;Stair training;Therapeutic exercise;Therapeutic activities   PT Goals (Current goals can be found in the Care Plan section) Acute Rehab PT Goals Patient Stated Goal: to perform ADLs  independently  PT Goal Formulation: With patient Time For Goal Achievement: 09/29/15 Potential to Achieve Goals: Good    Frequency BID   Barriers to discharge        Co-evaluation               End of Session Equipment Utilized During Treatment: Gait belt Activity Tolerance: Patient tolerated treatment well Patient left: in chair;with call bell/phone within reach;with chair alarm set;with family/visitor present Nurse Communication: Other (comment) (Nurse came in during treatment to take vitals )         Time: 1418-1500 PT Time Calculation (min) (ACUTE ONLY): 42 min   Charges:         PT G Codes:        Sherral Hammers 2015/09/28, 4:38 PM M. Barnett Abu, SPT

## 2015-09-15 NOTE — Progress Notes (Signed)
To OR with thermal cap in place Ancef, TXA, sacral dressing sent to OR with patient

## 2015-09-16 ENCOUNTER — Encounter: Payer: Self-pay | Admitting: Orthopedic Surgery

## 2015-09-16 LAB — BASIC METABOLIC PANEL
Anion gap: 3 — ABNORMAL LOW (ref 5–15)
BUN: 11 mg/dL (ref 6–20)
CO2: 28 mmol/L (ref 22–32)
CREATININE: 0.79 mg/dL (ref 0.61–1.24)
Calcium: 8.3 mg/dL — ABNORMAL LOW (ref 8.9–10.3)
Chloride: 107 mmol/L (ref 101–111)
GFR calc Af Amer: >60 mL/min (ref >60)
GLUCOSE: 103 mg/dL — AB (ref 65–99)
POTASSIUM: 4.3 mmol/L (ref 3.5–5.1)
Sodium: 138 mmol/L (ref 135–145)

## 2015-09-16 LAB — CBC
HCT: 35.2 % — ABNORMAL LOW (ref 40.0–52.0)
Hemoglobin: 11.8 g/dL — ABNORMAL LOW (ref 13.0–18.0)
MCH: 30.9 pg (ref 26.0–34.0)
MCHC: 33.4 g/dL (ref 32.0–36.0)
MCV: 92.6 fL (ref 80.0–100.0)
PLATELETS: 135 10*3/uL — AB (ref 150–440)
RBC: 3.8 MIL/uL — AB (ref 4.40–5.90)
RDW: 14.1 % (ref 11.5–14.5)
WBC: 5.2 10*3/uL (ref 3.8–10.6)

## 2015-09-16 MED ORDER — ONDANSETRON HCL 4 MG PO TABS: 4.0000 mg | ORAL_TABLET | Freq: Four times a day (QID) | ORAL | Status: DC | PRN

## 2015-09-16 MED ORDER — ASPIRIN EC 81 MG PO TBEC
81.0000 mg | DELAYED_RELEASE_TABLET | Freq: Every day | ORAL | Status: DC
  Administered 2015-09-16 – 2015-09-18 (×3): 81 mg via ORAL
  Filled 2015-09-16 (×3): qty 1

## 2015-09-16 MED ORDER — OXYCODONE HCL 5 MG PO TABS: 5.0000 mg | ORAL_TABLET | ORAL | Status: DC | PRN

## 2015-09-16 MED ORDER — ENOXAPARIN SODIUM 40 MG/0.4ML ~~LOC~~ SOLN: 40.0000 mg | SUBCUTANEOUS | Status: DC

## 2015-09-16 NOTE — Discharge Instructions (Signed)

## 2015-09-16 NOTE — Clinical Social Work Placement (Signed)
   CLINICAL SOCIAL WORK PLACEMENT  NOTE  Date:  09/16/2015  Patient Details  Name: Curtis Carroll MRN: [id number] Date of Birth: May 06, 1946  Clinical Social Work is seeking post-discharge placement for this patient at the [place] level of care (*CSW will initial, date and re-position this form in  chart as items are completed):  Yes   Patient/family provided with [facility] Work Department's list of facilities offering this level of care within the geographic area requested by the patient (or if unable, by the patient's family).  Yes   Patient/family informed of their freedom to choose among providers that offer the needed level of care, that participate in Medicare, Medicaid or managed care program needed by the patient, have an available bed and are willing to accept the patient.  Yes   Patient/family informed of [facility]'s ownership interest in [facility], as well as of the fact that they are under no obligation to receive care at these facilities.  PASRR submitted to EDS on 09/16/15     PASRR number received on 09/16/15     Existing PASRR number confirmed on       FL2 transmitted to all facilities in geographic area requested by pt/family on 09/16/15     FL2 transmitted to all facilities within larger geographic area on       Patient informed that his/her managed care company has contracts with or will negotiate with certain facilities, including the following:        Yes   Patient/family informed of bed offers received.  Patient chooses bed at  [facility] )     Physician recommends and patient chooses bed at      Patient to be transferred to   on  .  Patient to be transferred to facility by       Patient family notified on   of transfer.  Name of family member notified:        PHYSICIAN       Additional Comment:    _______________________________________________ Loralyn Freshwater,  LCSW 09/16/2015, 4:05 PM

## 2015-09-16 NOTE — Clinical Social Work Note (Signed)
Clinical Social Work Assessment  Patient Details  Name: MARSHUN DUVA MRN: [id number] Date of Birth: 1945-10-10  Date of referral:  09/16/15               Reason for consult:  Facility Placement                Permission sought to share information with:  Chartered certified accountant granted to share information::  Yes, Verbal Permission Granted  Name::      [place]::   [place]   Relationship::     Contact Information:     Housing/Transportation Living arrangements for the past 2 months:  [place] of Information:  Patient, Spouse Patient Interpreter Needed:  None Criminal Activity/Legal Involvement Pertinent to Current Situation/Hospitalization:  No - Comment as needed Significant Relationships:  Spouse Lives with:  Spouse Do you feel safe going back to the place where you live?  Yes Need for family participation in patient care:  Yes (Comment)  Care giving concerns:  Patient lives in [place] with his wife Juliann Pulse.    Social Worker assessment / plan:  Holiday representative (CSW) received verbal consult from PT that recommendation has changed from home health on post op day 0 to SNF on post op day 1. CSW met with patient and his wife Juliann Pulse was at bedside. CSW introduced self and explained role of CSW department. Patient reported that he lives in [place] with his wife Juliann Pulse. Per patient he still works, which is why his Lorella Nimrod is primary and Medicare is secondary. CSW explained that PT is recommending SNF. Patient reported that he prefers to go home and he stated he walked around the nurses station today. Patient reported that he lives in a ranch style house, which is one Patent attorney. Per patient he has all the DME equipment at home he needs. Patient is agreeable to SNF search as a back up plan.   CSW presented bed offers and patient chose [facility]. Doug admissions coordinator at [facility] started [facility]  authorization Friday 09/16/15. Per Marden Noble he will accept a 5 day LOG so patient can discharge to [facility] on Sunday 09/18/15. Per Doug patient will go to room 408. CSW sent Doug D/C orders Friday. Zack approved 5 day LOG. CSW will continue to follow and assist as needed.   Employment status:  Disabled (Comment on whether or not currently receiving Disability), Retired Forensic scientist:  Medicare, Managed Care PT Recommendations:  [place] / Referral to community resources:  [place]  Patient/Family's Response to care:  Patient is agreeable to go to [facility] if he does not improve with PT.   Patient/Family's Understanding of and Emotional Response to Diagnosis, Current Treatment, and Prognosis:  Patient was pleasant and thanked CSW for visit.   Emotional Assessment Appearance:  Appears stated age Attitude/Demeanor/Rapport:    Affect (typically observed):  Accepting, Adaptable, Pleasant Orientation:  Oriented to Place, Oriented to Self, Oriented to  Time, Oriented to Situation Alcohol / Substance use:  Not Applicable Psych involvement (Current and /or in the community):  No (Comment)  Discharge Needs  Concerns to be addressed:  Discharge Planning Concerns Readmission within the last 30 days:  No Current discharge risk:  Dependent with Mobility Barriers to Discharge:  Continued Medical Work up   Loralyn Freshwater, LCSW 09/16/2015, 4:06 PM

## 2015-09-16 NOTE — Progress Notes (Signed)
Clinical Social Worker (CSW) received SNF consult. PT is recommending home health. RN Case Manager is aware of above. Please reconsult if future social work needs arise. CSW signing off.   Maryetta Shafer Morgan, LCSW [phone] 

## 2015-09-16 NOTE — Progress Notes (Addendum)
   Subjective: 1 Day Post-Op Procedure(s) (LRB): TOTAL KNEE ARTHROPLASTY (Left) Patient reports pain as 4 on 0-10 scale.   Patient is well, and has had no acute complaints or problems. Eye pain has resolved. Denies any CP, SOB, ABD pain. We will continue therapy today.  Plan is to go Home after hospital stay.  Objective: Vital signs in last 24 hours: Temp:  [96.9 F (36.1 C)-98.7 F (37.1 C)] 98.7 F (37.1 C) (04/21 0726) Pulse Rate:  [49-70] 64 (04/21 0726) Resp:  [13-20] 18 (04/21 0726) BP: (89-124)/(56-69) 110/59 mmHg (04/21 0726) SpO2:  [91 %-100 %] 91 % (04/21 0726)  Intake/Output from previous day: 04/20 0701 - 04/21 0700 In: 3366.3 [P.O.:870; I.V.:2496.3] Out: 1975 [Urine:1825; Blood:150] Intake/Output this shift:     Recent Labs  09/15/15 1105 09/16/15 0453  HGB 13.3 11.8*    Recent Labs  09/15/15 1105 09/16/15 0453  WBC 4.4 5.2  RBC 4.26* 3.80*  HCT 39.5* 35.2*  PLT 142* 135*    Recent Labs  09/15/15 1105 09/16/15 0453  NA  --  138  K  --  4.3  CL  --  107  CO2  --  28  BUN  --  11  CREATININE 0.72 0.79  GLUCOSE  --  103*  CALCIUM  --  8.3*   No results for input(s): LABPT, INR in the last 72 hours.  EXAM General - Patient is Alert, Appropriate and Oriented Extremity - Neurovascular intact Sensation intact distally Intact pulses distally Dorsiflexion/Plantar flexion intact Dressing - dressing C/D/I, no drainage and woundvac intact with 150 cc output Motor Function - intact, moving foot and toes well on exam.   Past Medical History  Diagnosis Date  . Celiac disease   . Polio   . Hypertension   . Arthritis     Assessment/Plan:   1 Day Post-Op Procedure(s) (LRB): TOTAL KNEE ARTHROPLASTY (Left) Active Problems:   Primary osteoarthritis of left knee  Acute post op blood loss anemia    Estimated body mass index is 28.88 kg/(m^2) as calculated from the following:   Height as of this encounter: 5\' 11"  (1.803 m).   Weight as of  this encounter: 93.895 kg (207 lb). Advance diet Up with therapy  Needs BM Recheck labs in the am Continue with wound vac until discharge. At discharge, apply Provena plus disposable wound vac. Patient will need to follow up with [facility] in 7-8 days for dressing change.   DVT Prophylaxis - Lovenox, Foot Pumps and TED hose Weight-Bearing as tolerated to left leg D/C O2 and Pulse OX and try on Room Air  T. Rachelle Hora, PA-C [place] 09/16/2015, 7:47 AM

## 2015-09-16 NOTE — Progress Notes (Signed)
Physical Therapy Treatment Patient Details Name: Curtis Carroll MRN: [id number] DOB: May 15, 1946 Today's Date: 09/16/2015    History of Present Illness Pt is a 70 y.o. M who received L TKA on 4/20. Pt has hx of polio, HTN, OA and celiac disease.     PT Comments    Pt is progressing towards goals. Pt able to perform bed mobility with mod assist for L LE and use of trapeze. Pt able to transfer with RW and max assist, demonstrating slight LOB due to R LE weakness. Pt required increased time to distribute WB b/w B LE. Pt able to ambulate approx 25 ft with RW and min assist with frequent cues regarding walker and foot placement. Pt performed supine there-ex on L LE with min to no assist. Pt demonstrates deficits in strength, ROM, mobility and use of DME. Pt would benefit from further skilled PT to address deficits and return to PLOF; recommend pt sent to SNF after discharge from acute hospitalization.   Follow Up Recommendations  SNF     Equipment Recommendations  Rolling walker with 5" wheels    Recommendations for Other Services       Precautions / Restrictions Precautions Precautions: Fall Restrictions Weight Bearing Restrictions: Yes LLE Weight Bearing: Weight bearing as tolerated    Mobility  Bed Mobility Overal bed mobility: Needs Assistance Bed Mobility: Supine to Sit     Supine to sit: Mod assist     General bed mobility comments: Pt able to perform bed mobility with mod assist and use of trapeze. Pt required assist with moving L LE. Pt provided cues on hand placement during mobility.   Transfers Overall transfer level: Needs assistance Equipment used: Rolling walker (2 wheeled) Transfers: Sit to/from Stand Sit to Stand: Max assist         General transfer comment: Pt able to transfer from EOB using RW and max assist. Pt required increased time when rising and maintaining WBAT on L LE. Pt demonstrated slight LOB during transfer d/t R LE weakness. Pt required  heavy use of B UE on RW during transfer. Elevated bed to help with transfer. Once standing, pt able to maintain WB status.   Ambulation/Gait Ambulation/Gait assistance: Min assist Ambulation Distance (Feet): 25 Feet Assistive device: Rolling walker (2 wheeled) Gait Pattern/deviations: Step-to pattern Gait velocity: slow   General Gait Details: Pt able to ambulate approx 25 ft using RW and min assist. Pt demonstrated step-to gait pattern with large steps on B LE. Pt provided heavy cues regarding walker placement and step length. Pt required heavy reliance on B UE with RW during ambulation.    Stairs            Wheelchair Mobility    Modified Rankin (Stroke Patients Only)       Balance                                    Cognition Arousal/Alertness: Awake/alert Behavior During Therapy: WFL for tasks assessed/performed Overall Cognitive Status: Within Functional Limits for tasks assessed                      Exercises Total Joint Exercises Goniometric ROM: L LE AAROM: 4 - 73 degrees Other Exercises Other Exercises: Pt able to perfrom supine ther-ex on L LE including ankle pumps and quad sets with no assist, hip ab/ad, SLR and SAQ with min assist. All ther-ex  performed x12 reps.     General Comments        Pertinent Vitals/Pain Pain Assessment: 0-10 Pain Score: 6  Pain Location: L Knee Pain Descriptors / Indicators: Operative site guarding Pain Intervention(s): Premedicated before session    Home Living                      Prior Function            PT Goals (current goals can now be found in the care plan section) Acute Rehab PT Goals Patient Stated Goal: to perform ADLs independently  PT Goal Formulation: With patient Time For Goal Achievement: 09/29/15 Potential to Achieve Goals: Good Progress towards PT goals: Progressing toward goals    Frequency  BID    PT Plan Discharge plan needs to be updated     Co-evaluation             End of Session Equipment Utilized During Treatment: Gait belt Activity Tolerance: Patient tolerated treatment well Patient left: in chair;with chair alarm set;with family/visitor present     Time: [id number] PT Time Calculation (min) (ACUTE ONLY): 30 min  Charges:  $Gait Training: 8-22 mins $Therapeutic Exercise: 8-22 mins                    G Codes:      Sherral Hammers 09/18/2015, 11:13 AM M. Barnett Abu, SPT

## 2015-09-16 NOTE — Care Management Note (Addendum)
Case Management Note  Patient Details  Name: Curtis Carroll MRN: [id number] Date of Birth: [birth year]  Subjective/Objective:                  Met again with patient and his wife to discuss discharge planning. He plans to return home at discharge. He has an elevated toilet at home and a front-wheeled walker. [facility] uses [facility] [phone] for Rx. He would like to [facility]. They have not concerns about returning home but want to wait until Sunday.   Action/Plan:  List of home health agencies left with wife yesterday. Referral to [facility]. Lovenox 7m #14 called in to [facility] for price. RNCM will continue to follow. I explained that MD would work with him on meeting discharge goals.   Expected Discharge Date:  09/17/15               Expected Discharge Plan:     In-House Referral:     Discharge planning Services  CM Consult  Post Acute Care Choice:  Home Health Choice offered to:  Patient, Spouse  DME Arranged:    DME Agency:     HH Arranged:  PT HH Agency:  [place] Status of Service:  In process, will continue to follow  Medicare Important Message Given:    Date Medicare IM Given:    Medicare IM give by:    Date Additional Medicare IM Given:    Additional Medicare Important Message give by:     If [place], dates discussed:    Additional Comments: Lovenox $23.14.   AMarshell Garfinkel RN 09/16/2015, 9:44 AM

## 2015-09-16 NOTE — Progress Notes (Signed)
650 mg of Asprin schedule for this am, MD Rudene Christians notified to confirm dose. Orders received to change dose to 81 mg of Asprin. Order placed.

## 2015-09-16 NOTE — NC FL2 (Signed)
[place] LEVEL OF CARE SCREENING TOOL     IDENTIFICATION  Patient Name: Curtis Carroll Birthdate: 29-Jan-1946 Sex: male Admission Date (Current Location): 09/15/2015  [place] and [place] Number:  Engineering geologist and Address:  [facility], [address] [place], [place] [address]      Provider Number: [id number]  Attending Physician Name and Address:  Hessie Knows, MD  Relative Name and Phone Number:       Current Level of Care: Hospital Recommended Level of Care: [place] Prior Approval Number:    Date Approved/Denied:   PASRR Number:  ([id number])  Discharge Plan: SNF    Current Diagnoses: Patient Active Problem List   Diagnosis Date Noted  . Primary osteoarthritis of left knee 09/15/2015   Polio    Hypertension    Hyperlipidemia, unspecified       Orientation RESPIRATION BLADDER Height & Weight     Self, Time, Situation, Place  Normal Continent Weight: 207 lb (93.895 kg) Height:  5\' 11"  (180.3 cm)  BEHAVIORAL SYMPTOMS/MOOD NEUROLOGICAL BOWEL NUTRITION STATUS   (none )  (none ) Continent Diet (Regular Diet )  AMBULATORY STATUS COMMUNICATION OF NEEDS Skin   Extensive Assist Verbally Surgical wounds (Incision Left Knee )                       Personal Care Assistance Level of Assistance  Bathing, Feeding, Dressing Bathing Assistance: Independent Feeding assistance: Limited assistance Dressing Assistance: Independent     Functional Limitations Info  Sight, Hearing, Speech Sight Info: Adequate Hearing Info: Adequate Speech Info: Adequate    SPECIAL CARE FACTORS FREQUENCY  PT (By licensed PT), OT (By licensed OT)     PT Frequency:  (5) OT Frequency:  (5)            Contractures      Additional Factors Info  Code Status, Allergies Code Status Info:  (Not on File ) Allergies Info:  (Black Pepper, Codeine, Lactose Intolerance (Gi), Penicillins, Wheat Bran)            Current Medications (09/16/2015):  This is the current hospital active medication list Current Facility-Administered Medications  Medication Dose Route Frequency Provider Last Rate Last Dose  . 0.9 %  sodium chloride infusion   Intravenous Continuous Hessie Knows, MD   Stopped at 09/16/15 1050  . acetaminophen (TYLENOL) tablet 650 mg  650 mg Oral Q6H PRN Hessie Knows, MD       Or  . acetaminophen (TYLENOL) suppository 650 mg  650 mg Rectal Q6H PRN Hessie Knows, MD      . aspirin EC tablet 81 mg  81 mg Oral Daily Hessie Knows, MD   81 mg at 09/16/15 1051  . atorvastatin (LIPITOR) tablet 5 mg  5 mg Oral q1800 Hessie Knows, MD   5 mg at 09/15/15 1613  . bisacodyl (DULCOLAX) suppository 10 mg  10 mg Rectal Daily PRN Hessie Knows, MD      . docusate sodium (COLACE) capsule 100 mg  100 mg Oral BID Hessie Knows, MD   100 mg at 09/16/15 0844  . enoxaparin (LOVENOX) injection 30 mg  30 mg Subcutaneous Q12H Hessie Knows, MD   30 mg at 09/16/15 0844  . erythromycin ophthalmic ointment   Right Eye Q6H Birder Robson, MD   1 application at [id number]  . levothyroxine (SYNTHROID, LEVOTHROID) tablet 100 mcg  100 mcg Oral QAC breakfast Hessie Knows, MD  100 mcg at 09/16/15 0650  . lisinopril (PRINIVIL,ZESTRIL) tablet 10 mg  10 mg Oral QHS Hessie Knows, MD   10 mg at 09/15/15 2151  . magnesium citrate solution 1 Bottle  1 Bottle Oral Once PRN Hessie Knows, MD      . magnesium hydroxide (MILK OF MAGNESIA) suspension 30 mL  30 mL Oral Daily PRN Hessie Knows, MD   30 mL at 09/16/15 0650  . menthol-cetylpyridinium (CEPACOL) lozenge 3 mg  1 lozenge Oral PRN Hessie Knows, MD       Or  . phenol (CHLORASEPTIC) mouth spray 1 spray  1 spray Mouth/Throat PRN Hessie Knows, MD      . methocarbamol (ROBAXIN) tablet 500 mg  500 mg Oral Q6H PRN Hessie Knows, MD       Or  . methocarbamol (ROBAXIN) 500 mg in dextrose 5 % 50 mL IVPB  500 mg Intravenous Q6H PRN Hessie Knows, MD      . metoCLOPramide (REGLAN) tablet 5-10  mg  5-10 mg Oral Q8H PRN Hessie Knows, MD       Or  . metoCLOPramide (REGLAN) injection 5-10 mg  5-10 mg Intravenous Q8H PRN Hessie Knows, MD      . morphine 2 MG/ML injection 2 mg  2 mg Intravenous Q1H PRN Hessie Knows, MD      . multivitamin with minerals tablet 1 tablet  1 tablet Oral Daily Hessie Knows, MD   1 tablet at 09/15/15 1806  . naphazoline-glycerin (CLEAR EYES) ophth solution 1-2 drop  1-2 drop Right Eye QID PRN Hessie Knows, MD      . ondansetron [facility]) tablet 4 mg  4 mg Oral Q6H PRN Hessie Knows, MD       Or  . ondansetron [facility]) injection 4 mg  4 mg Intravenous Q6H PRN Hessie Knows, MD      . oxyCODONE (Oxy IR/ROXICODONE) immediate release tablet 5-10 mg  5-10 mg Oral Q3H PRN Hessie Knows, MD   5 mg at 09/16/15 1216  . zolpidem (AMBIEN) tablet 5 mg  5 mg Oral QHS PRN Hessie Knows, MD   5 mg at 09/15/15 2152     Discharge Medications: Please see discharge summary for a list of discharge medications.  Relevant Imaging Results:  Relevant Lab Results:   Additional Information  (SSN: [id number])  Loralyn Freshwater, LCSW

## 2015-09-16 NOTE — Progress Notes (Signed)
Physical Therapy Treatment Patient Details Name: Curtis Carroll MRN: [id number] DOB: January 16, 1946 Today's Date: 09/16/2015    History of Present Illness Pt is a 70 y.o. M who received L TKA on 4/20. Pt has hx of polio, HTN, OA and celiac disease.     PT Comments    Pt is progressing towards goals, however limited by pain and fatigue. Pt able to perform bed mobility with use of trapeze and mod assist for L LE. Pt able to transfer from recliner with 2+ max assist and RW. Pt demonstrated LOB when standing, requiring increased time and assist to maintain balance. Pt able to ambulate approx 80 ft with RW and min assist. Pt took 2 rest breaks during ambulation and had to sit for approx 5 minutes during ambulation d/t nausea and fatigue. Pt performed supine there-ex with min to no assist. Pt demonstrates deficits in strength, balance, mobility, and use of DME. Pt would benefit from further skilled PT to address deficits; recommend pt sent to SNF after discharge from acute hospitalization.   Follow Up Recommendations  SNF     Equipment Recommendations  Rolling walker with 5" wheels    Recommendations for Other Services       Precautions / Restrictions Precautions Precautions: Fall Restrictions Weight Bearing Restrictions: Yes LLE Weight Bearing: Weight bearing as tolerated    Mobility  Bed Mobility Overal bed mobility: Needs Assistance Bed Mobility: Sit to Supine       Sit to supine: Mod assist   General bed mobility comments: Pt able to perform bed mobility with mod assist and use of trapeze. Pt required assist with moving L LE.  Transfers Overall transfer level: Needs assistance Equipment used: Rolling walker (2 wheeled) Transfers: Sit to/from Stand Sit to Stand: Max assist         General transfer comment: Pt able to transfer from recliner with 2+ max assist. Pt demonstrated LOB when standing due to R LE weakness. Pt demonstrated heavy use of B UE on RW causing the  walker to tip to side. Pt required increased time to stand. Pt provided heavy cues regarding hand and foot placement    Ambulation/Gait Ambulation/Gait assistance: Min assist Ambulation Distance (Feet): 80 Feet Assistive device: Rolling walker (2 wheeled) Gait Pattern/deviations: Step-to pattern     General Gait Details: Pt able to ambulate approx 80 ft using RW and min assist. Pt very impulsive with ambulation, tried to go faster than what was safe. Continuously had to instruct pt to slow down. Pt provided heavy cues to keep walker near body, however pt continued to reach for walker. Pt provided heavy cues regarding proper walker and foot placement with turns. Pt had to take 2 rest breaks during ambulation. During second break pt expressed feeling sick, sat in chair for approx 5 minutes before continuing to ambulate.    Stairs            Wheelchair Mobility    Modified Rankin (Stroke Patients Only)       Balance                                    Cognition Arousal/Alertness: Awake/alert Behavior During Therapy: WFL for tasks assessed/performed Overall Cognitive Status: Within Functional Limits for tasks assessed                      Exercises Other Exercises Other Exercises: Pt able  to perform supine ther-ex on L LE including ankle pumps and quad sets with no assist, hip ab/ad, SLR and SAQ with min assist. All ther-ex performed x12 reps.     General Comments        Pertinent Vitals/Pain Pain Assessment: 0-10 Pain Score: 4  Pain Location: L Knee Pain Descriptors / Indicators: Operative site guarding Pain Intervention(s): Premedicated before session    Home Living                      Prior Function            PT Goals (current goals can now be found in the care plan section) Acute Rehab PT Goals Patient Stated Goal: to perform ADLs independently  PT Goal Formulation: With patient Time For Goal Achievement: 09/29/15 Potential  to Achieve Goals: Good Progress towards PT goals: Progressing toward goals    Frequency  BID    PT Plan Current plan remains appropriate    Co-evaluation             End of Session Equipment Utilized During Treatment: Gait belt Activity Tolerance: Patient limited by fatigue Patient left: in bed;with bed alarm set;with family/visitor present;with call bell/phone within reach     Time: 1310-1335 PT Time Calculation (min) (ACUTE ONLY): 25 min  Charges:                       G Codes:      Sherral Hammers 10/13/2015, 2:35 PM M. Barnett Abu, SPT

## 2015-09-16 NOTE — Anesthesia Postprocedure Evaluation (Signed)
Anesthesia Post Note  Patient: Curtis Carroll  Procedure(s) Performed: Procedure(s) (LRB): TOTAL KNEE ARTHROPLASTY (Left)  Patient location during evaluation: Nursing Unit Anesthesia Type: Spinal Level of consciousness: awake, awake and alert and oriented Pain management: pain level controlled Vital Signs Assessment: post-procedure vital signs reviewed and stable Respiratory status: spontaneous breathing, nonlabored ventilation and respiratory function stable Cardiovascular status: blood pressure returned to baseline Postop Assessment: no headache, no backache, patient able to bend at knees and no signs of nausea or vomiting Anesthetic complications: no    Last Vitals:  Filed Vitals:   09/15/15 2350 09/16/15 0430  BP: 107/64 124/62  Pulse: 64 66  Temp: 37.1 C 36.8 C  Resp: 20 18    Last Pain:  Filed Vitals:   09/16/15 0430  PainSc: Asleep                 Johnna Acosta

## 2015-09-16 NOTE — Evaluation (Signed)
Occupational Therapy Evaluation Patient Details Name: Curtis Carroll MRN: [id number] DOB: September 09, 1945 Today's Date: 09/16/2015    History of Present Illness Pt is a 70 y.o. M who received L TKA on 4/20. Pt has hx of polio, HTN, OA and celiac disease.    Clinical Impression   Pt is 70 year old male s/p left TKR.  Pt was independent in all ADLs/IADLs/ and work related tasks prior to surgery and is eager to return to his PLOF.  Pt currently requires moderate assist for LB ADLs while in seated position due to pain and limited AROM of the left knee.  Pt would benefit from continued instruction in LE dressing techniques with or without assistive devices for LE ADLs.  Pt would also benefit from recommendations for home modifications to increase safety in the bathroom and prevent falls.    Follow Up Recommendations  Home health OT    Equipment Recommendations       Recommendations for Other Services PT consult        Precautions Precautions: Fall Restrictions Weight Bearing Restrictions: Yes LLE Weight Bearing: Weight bearing as tolerated      Mobility Bed Mobility  Transfers Overall transfer level: Needs assistance Equipment used: Rolling walker (2 wheeled) Transfers: Sit to/from Stand Sit to Stand: Mod assist            Balance Overall balance assessment: Needs assistance   Sitting balance-Leahy Scale: Good       Standing balance-Leahy Scale: Fair                              ADL Overall ADL's : Needs assistance/impaired Eating/Feeding: Independent   Grooming: Sitting;Set up               Lower Body Dressing: Moderate assistance;With adaptive equipment                 General ADL Comments: Pt. performed [place] sit to stand from chair in preparation for simulating clothing hiking.     Vision     Perception     Praxis      Pertinent Vitals/Pain Pain Assessment: 0-10 Pain Score: 4  Pain Location: left knee Pain Descriptors /  Indicators: Operative site guarding Pain Intervention(s): Premedicated before session     Hand Dominance     Extremity/Trunk Assessment Upper Extremity Assessment Upper Extremity Assessment: Overall WFL for tasks assessed           Communication Communication Communication: No difficulties   Cognition Arousal/Alertness: Awake/alert Behavior During Therapy: WFL for tasks assessed/performed Overall Cognitive Status: Within Functional Limits for tasks assessed                     General Comments       Exercises Exercises: Total Joint;Other exercises Other Exercises Other Exercises: Pt able to perform supine ther-ex on L LE including ankle pumps and quad sets with no assist, hip ab/ad, SLR and SAQ with min assist. All ther-ex performed x12 reps.    Shoulder Instructions      Home Living Family/patient expects to be discharged to:: Private residence Living Arrangements: Spouse/significant other Available Help at Discharge: Family Type of Home: House Home Access: Stairs to enter Technical brewer of Steps: 3   Home Layout: One level         Biochemist, clinical: [place] - single point;Toilet riser   Additional Comments:  Pt. wife is available to assist.      Prior Functioning/Environment Level of Independence: Independent        Comments: Independent with ADLs, IADLs, driving, and working for [employer].    OT Diagnosis: Generalized weakness;Acute pain   OT Problem List: Decreased strength;Decreased activity tolerance;Impaired balance (sitting and/or standing);Decreased knowledge of use of DME or AE;Pain   OT Treatment/Interventions: Self-care/ADL training;Therapeutic exercise;DME and/or AE instruction;Therapeutic activities;Patient/family education    OT Goals(Current goals can be found in the care plan section) Acute Rehab OT Goals Patient Stated Goal: to perform ADLs independently  OT Goal Formulation: With  patient/family Time For Goal Achievement: 09/30/15 Potential to Achieve Goals: Good  OT Frequency: Min 1X/week   Barriers to D/C:            Co-evaluation              End of Session Equipment Utilized During Treatment: Gait belt;Rolling walker  Activity Tolerance: Patient tolerated treatment well Patient left: in bed;with call bell/phone within reach;with chair alarm set;with family/visitor present   Time: 1000-1034 OT Time Calculation (min): 34 min Charges:  OT General Charges $OT Visit: 1 Procedure OT Evaluation $OT Eval Moderate Complexity: 1 Procedure OT Treatments $Self Care/Home Management : 8-22 mins G-Codes:    Harrel Carina, MS, OTR/L Harrel Carina 09/16/2015, 3:27 PM

## 2015-09-17 LAB — BASIC METABOLIC PANEL
ANION GAP: 7 (ref 5–15)
BUN: 9 mg/dL (ref 6–20)
CALCIUM: 8.4 mg/dL — AB (ref 8.9–10.3)
CO2: 27 mmol/L (ref 22–32)
Chloride: 100 mmol/L — ABNORMAL LOW (ref 101–111)
Creatinine, Ser: 0.75 mg/dL (ref 0.61–1.24)
GFR calc Af Amer: >60 mL/min (ref >60)
GLUCOSE: 122 mg/dL — AB (ref 65–99)
POTASSIUM: 4.6 mmol/L (ref 3.5–5.1)
Sodium: 134 mmol/L — ABNORMAL LOW (ref 135–145)

## 2015-09-17 LAB — CBC
HEMATOCRIT: 34 % — AB (ref 40.0–52.0)
Hemoglobin: 11.5 g/dL — ABNORMAL LOW (ref 13.0–18.0)
MCH: 31.6 pg (ref 26.0–34.0)
MCHC: 33.8 g/dL (ref 32.0–36.0)
MCV: 93.4 fL (ref 80.0–100.0)
PLATELETS: 135 10*3/uL — AB (ref 150–440)
RBC: 3.64 MIL/uL — AB (ref 4.40–5.90)
RDW: 14 % (ref 11.5–14.5)
WBC: 5.7 10*3/uL (ref 3.8–10.6)

## 2015-09-17 NOTE — Progress Notes (Addendum)
Physical Therapy Treatment Patient Details Name: Curtis Carroll MRN: [id number] DOB: 1945/09/04 Today's Date: 09/17/2015    History of Present Illness Pt is a 70 y.o. M who received L TKA on 4/20. Pt has hx of polio, HTN, OA and celiac disease.     PT Comments    Pt fatigued, but agreeable to PT. Pt notes pain is decreasing post pain medicine approximately an hour ago. Pt participates in Left lower extremity stretching and strengthening exercises with education provided regarding technique, frequency and duration to pt and family. Transfers and ambulation effortful for pt, complicated by Right lower extremity weakness due to polio. Pt encouraged to use Left lower extremity as much as possible to with sit to/from stand transfers. Encouraged pre gait weight shift to ensure readiness for ambulation. Increased instruction provided for slower, more controlled steps (especially on turns)  focusing on step lengths and quad set with stance phase. Pt does have new immobilizer in room, but does not feel he needs to use at this time. Pt does demonstrate ability to control left knee without buckling. Agree with use of musculature versus relying on immobilizer, but instructed if at any time pt feels unable to control, use of immobilizer would be best. Pt compliant with instructions and demonstrates improved step length, control and safe turning throughout session with Min A. Pt participates in stretching and strengthening exercises for Left lower extremity and encouraged continuation throughout the day. Continue PT for progression of range of motion, strengthening, quality of gait and safety for improved functional mobility.   Follow Up Recommendations  SNF     Equipment Recommendations  Rolling walker with 5" wheels    Recommendations for Other Services       Precautions / Restrictions Precautions Precautions: Fall Restrictions Weight Bearing Restrictions: Yes LLE Weight Bearing: Weight bearing as  tolerated    Mobility  Bed Mobility Overal bed mobility: Needs Assistance Bed Mobility: Sit to Supine     Supine to sit: Min assist     General bed mobility comments: Not tested; up in chair  Transfers Overall transfer level: Needs assistance Equipment used: Rolling walker (2 wheeled) Transfers: Sit to/from Stand Sit to Stand: Mod assist         General transfer comment: Cues for safe hand placement use of LLE and increased time to attain steady full upright position Encouraged stand weight shift as pre gait activity to ensure weigthtbearing tolerance   Ambulation/Gait Ambulation/Gait assistance: Min assist Ambulation Distance (Feet): 40 Feet Assistive device: Rolling walker (2 wheeled) Gait Pattern/deviations: Step-to pattern;Decreased step length - right;Decreased step length - left;Decreased weight shift to left (decreased flexion L knee with swing) Gait velocity: slow Gait velocity interpretation: <1.8 ft/sec, indicative of risk for recurrent falls General Gait Details: Heavy lean on rw; pt notes he does have sturdier rw at home. Cues for smaller controlled step lengths with focus on L quad set on stance phase to ensure stability. Cues on turns also for controlled smaller steps versus twisting. Pt demonstrates improved technieques post cueing. Ambulation is effortful especially due to weak RLE.    Stairs            Wheelchair Mobility    Modified Rankin (Stroke Patients Only)       Balance Overall balance assessment: Needs assistance         Standing balance support: Bilateral upper extremity supported Standing balance-Leahy Scale: Fair Standing balance comment: Mildly unsteady due to weakness throughout BLEs  Cognition Arousal/Alertness: Awake/alert Behavior During Therapy: WFL for tasks assessed/performed Overall Cognitive Status: Within Functional Limits for tasks assessed                      Exercises Total  Joint Exercises Ankle Circles/Pumps: AROM;Both;20 reps (long sit) Quad Sets: Strengthening;Both;20 reps (long sit with double ankle towel roll) Knee Flexion: AAROM;Left;10 reps;Seated (3 to 4 positions each rep with 10 second hold each) Goniometric ROM: 6-77    General Comments        Pertinent Vitals/Pain Pain Assessment: 0-10 Pain Score: 4  Pain Location: L knee Pain Descriptors / Indicators: Aching;Constant;Operative site guarding Pain Intervention(s): Monitored during session;Premedicated before session;Ice applied    Home Living Family/patient expects to be discharged to:: Private residence                    Prior Function            PT Goals (current goals can now be found in the care plan section) Acute Rehab PT Goals Patient Stated Goal: to perform ADLs independently  Progress towards PT goals: Progressing toward goals    Frequency  BID    PT Plan Current plan remains appropriate    Co-evaluation             End of Session Equipment Utilized During Treatment: Gait belt Activity Tolerance: Patient limited by fatigue;Patient limited by pain;Other (comment) (RLE weakness due to polio ) Patient left: in chair;with call bell/phone within reach;with chair alarm set;with family/visitor present;with SCD's reapplied;Other (comment) (polar care in place)     Time: [id number] PT Time Calculation (min) (ACUTE ONLY): 35 min  Charges:  $Gait Training: 8-22 mins $Therapeutic Exercise: 8-22 mins                    G Codes:      Charlaine Dalton, PTA 09/17/2015, 12:16 PM

## 2015-09-17 NOTE — Progress Notes (Signed)
   Subjective: 2 Days Post-Op Procedure(s) (LRB): TOTAL KNEE ARTHROPLASTY (Left) Patient reports pain as 4 on 0-10 scale.   Patient is well, and has had no acute complaints or problems. His pain seemed to increase last night as a numbing medicine was wearing off. He is less mobile but is still trying to move around in the bed. Denies any CP, SOB, ABD pain. We will continue therapy today.  Plan is to go Home after hospital stay. The plan is to go home on Sunday.  Objective: Vital signs in last 24 hours: Temp:  [98.7 F (37.1 C)-99.6 F (37.6 C)] 98.9 F (37.2 C) (04/22 0311) Pulse Rate:  [64-81] 81 (04/22 0311) Resp:  [18] 18 (04/22 0311) BP: (109-140)/(55-71) 135/67 mmHg (04/22 0311) SpO2:  [91 %-94 %] 94 % (04/22 0311)  Intake/Output from previous day: 04/21 0701 - 04/22 0700 In: 480 [P.O.:480] Out: 1050 [Urine:850; Drains:200] Intake/Output this shift: Total I/O In: -  Out: 1050 [Urine:850; Drains:200]   Recent Labs  09/15/15 1105 09/16/15 0453 09/17/15 0326  HGB 13.3 11.8* 11.5*    Recent Labs  09/16/15 0453 09/17/15 0326  WBC 5.2 5.7  RBC 3.80* 3.64*  HCT 35.2* 34.0*  PLT 135* 135*    Recent Labs  09/16/15 0453 09/17/15 0326  NA 138 134*  K 4.3 4.6  CL 107 100*  CO2 28 27  BUN 11 9  CREATININE 0.79 0.75  GLUCOSE 103* 122*  CALCIUM 8.3* 8.4*   No results for input(s): LABPT, INR in the last 72 hours.  EXAM General - Patient is Alert, Appropriate and Oriented Extremity - Neurovascular intact Sensation intact distally Intact pulses distally Dorsiflexion/Plantar flexion intact Dressing - dressing C/D/I, no drainage and woundvac intact with 150 cc output Motor Function - intact, moving foot and toes well on exam. The patient ambulated 80 feet with physical therapy yesterday.  Past Medical History  Diagnosis Date  . Celiac disease   . Polio   . Hypertension   . Arthritis     Assessment/Plan:   2 Days Post-Op Procedure(s) (LRB): TOTAL  KNEE ARTHROPLASTY (Left) Active Problems:   Primary osteoarthritis of left knee  Acute post op blood loss anemia    Estimated body mass index is 28.88 kg/(m^2) as calculated from the following:   Height as of this encounter: 5\' 11"  (1.803 m).   Weight as of this encounter: 93.895 kg (207 lb). Advance diet Up with therapy  Needs BM, which he is using medication for. Continue with wound vac until discharge. At discharge, apply Provena plus disposable wound vac. Patient will need to follow up with [place] [facility] in 7-8 days for dressing change.   DVT Prophylaxis - Lovenox, Foot Pumps and TED hose Weight-Bearing as tolerated to left leg   Reche Dixon, PA-C [place] 09/17/2015, 6:11 AM

## 2015-09-17 NOTE — Progress Notes (Signed)
Occupational Therapy Treatment Patient Details Name: Curtis Carroll MRN: [id number] DOB: [birth year] Today's Date: 09/17/2015    History of present illness Pt is a 70 y.o. M who received L TKA on 4/20. Pt has hx of polio, HTN, OA and celiac disease.    OT comments  Patient seen this date for OT treatment session.  He reports increased pain over the last day after anesthesia has worn off.  He was seen for bed mobility/transfers as well as lower body dressing techniques.  He is requiring moderate assist for transfers and increased assist for self care tasks and his wife will not be able to provide this level of care at home.  Unless patient progressing over the next day to requiring decreased assist, he may need to consider skilled rehab for a short time to increase independence in daily tasks prior to returning home.  Will continue to provide OT services in the acute setting to work towards improved independence.     Follow Up Recommendations  SNF (Increased pain, moderate assist for transfers and dressing, wife will not be able to provide this level of assist at home.)    Equipment Recommendations       Recommendations for Other Services      Precautions / Restrictions Precautions Precautions: Fall Restrictions Weight Bearing Restrictions: Yes LLE Weight Bearing: Weight bearing as tolerated       Mobility Bed Mobility Overal bed mobility: Needs Assistance Bed Mobility: Sit to Supine     Supine to sit: Min assist        Transfers Overall transfer level: Needs assistance Equipment used: Rolling walker (2 wheeled) Transfers: Sit to/from Stand Sit to Stand: Mod assist              Balance                                   ADL Overall ADL's : Needs assistance/impaired Eating/Feeding: Independent   Grooming: Sitting;Set up               Lower Body Dressing: Minimal assistance;Moderate assistance Lower Body Dressing Details (indicate cue  type and reason): Minimal assist to don/doff socks, moderate assist to stand to simulate clothing negotiation. Toilet Transfer: Moderate assistance   Toileting- Clothing Manipulation and Hygiene: Moderate assistance                Vision                     Perception     Praxis      Cognition   Behavior During Therapy: WFL for tasks assessed/performed Overall Cognitive Status: Within Functional Limits for tasks assessed                       Extremity/Trunk Assessment               Exercises     Shoulder Instructions       General Comments      Pertinent Vitals/ Pain       Pain Assessment: 0-10 Pain Score: 4  Pain Location: left knee Pain Descriptors / Indicators: Operative site guarding;Guarding Pain Intervention(s): Limited activity within patient's tolerance;Monitored during session;Premedicated before session  Home Living Family/patient expects to be discharged to:: Private residence  Prior Functioning/Environment              Frequency Min 1X/week     Progress Toward Goals  OT Goals(current goals can now be found in the care plan section)  Progress towards OT goals: Progressing toward goals  Acute Rehab OT Goals Patient Stated Goal: to perform ADLs independently  OT Goal Formulation: With patient/family Time For Goal Achievement: 09/30/15 Potential to Achieve Goals: Good  Plan Discharge plan needs to be updated    Co-evaluation                 End of Session Equipment Utilized During Treatment: Gait belt;Rolling walker   Activity Tolerance Patient tolerated treatment well   Patient Left in bed;with call bell/phone within reach;with chair alarm set;with family/visitor present   Nurse Communication          Time: [id number] OT Time Calculation (min): 27 min  Charges: OT General Charges $OT Visit: 1 Procedure OT Treatments $Self Care/Home  Management : 23-37 mins [place] Bryngelson T Oneda Duffett, OTR/L, CLT  Ej Pinson 09/17/2015, 11:20 AM

## 2015-09-17 NOTE — Care Management Note (Signed)
Case Management Note  Patient Details  Name: TORELL CARDENA MRN: [id number] Date of Birth: 06/23/1945  Subjective/Objective:   Discussed discharge planning with Mr Bassin and his wife. Wife reports that Rehab has already been set up. SW confirmed placement at [facility] on Sunday. Mr Brookens stated that he would make a final decision between going to Rehab or home with [facility] after his next PT session. Advised Mr Ortegon of his co-pay for Lovenox of $23.00 which this Probation officer will cancel tomorrow if he goes to Rehab. The Provena Plus Wound Vac which Mr Cichowski will be discharged with is in his room behind his night stand. Anticipate discharge to Rehab per Mr Tailor's complaints of pain.                  Action/Plan:   Expected Discharge Date:  09/17/15               Expected Discharge Plan:     In-House Referral:     Discharge planning Services  CM Consult  Post Acute Care Choice:  Home Health Choice offered to:  Patient, Spouse  DME Arranged:    DME Agency:     HH Arranged:  PT HH Agency:  [place]  Status of Service:  In process, will continue to follow  Medicare Important Message Given:    Date Medicare IM Given:    Medicare IM give by:    Date Additional Medicare IM Given:    Additional Medicare Important Message give by:     If discussed at [place] of Stay Meetings, dates discussed:    Additional Comments:  Jahmeir Geisen A, RN 09/17/2015, 3:29 PM

## 2015-09-18 NOTE — Clinical Social Work Note (Signed)
Pt is ready for discharge today to [facility]. Pt and wife are aware and agreeable to discharge plan. Facility has received discharge information, and CSW confirmed that pt will be discharged. RN will call report, and [facility] will provide transportation. CSW is singing off as no further needs identified.   Darden Dates, MSW, LCSW  Clinical Social Worker  [phone]

## 2015-09-18 NOTE — Progress Notes (Signed)
Dub Mikes to be D/C'd Rehab per MD order.  Discussed with the patient and all questions fully answered.  VSS, Skin clean, dry and intact without evidence of skin break down, no evidence of skin tears noted. IV catheter discontinued intact. Site without signs and symptoms of complications. Dressing and pressure applied.  An After Visit Summary was printed and given to the patient. Patient received prescription.  D/c education completed with patient/family including follow up instructions, medication list, d/c activities limitations if indicated, with other d/c instructions as indicated by MD - patient able to verbalize understanding, all questions fully answered.   Patient transferred  by EMS.   Deri Fuelling 09/18/2015 11:53 AM

## 2015-09-18 NOTE — Progress Notes (Signed)
Physical Therapy Treatment Patient Details Name: Curtis Carroll MRN: [id number] DOB: 11-30-1945 Today's Date: 09/18/2015    History of Present Illness Pt is a 70 y.o. M who received L TKA on 4/20. Pt has hx of polio, HTN, OA and celiac disease.     PT Comments    Pt's L knee continues to be stiff and sore and he is having a lot of pain with exercises and ROM acts but he remains motivated and willing to participate.  He did much better with ambulation today (both distance and gait pattern) but is still slow and hesitant, pt able to do some AROM LAQ, but has difficulty really getting the quad to fully engage.  Pt making steady gains, wife very supportive.    Follow Up Recommendations  SNF     Equipment Recommendations  Rolling walker with 5" wheels    Recommendations for Other Services       Precautions / Restrictions Precautions Precautions: Fall Restrictions LLE Weight Bearing: Weight bearing as tolerated    Mobility  Bed Mobility               General bed mobility comments: Pt in recliner, not tested  Transfers Overall transfer level: Modified independent Equipment used: Rolling walker (2 wheeled) Transfers: Sit to/from Stand Sit to Stand: Min guard         General transfer comment: Pt needs cues for foot and hand placement as well as positioning/set up, but was able to rise w/o direct physical assist  Ambulation/Gait Ambulation/Gait assistance: Min assist;Min guard Ambulation Distance (Feet): 100 Feet Assistive device: Rolling walker (2 wheeled)       General Gait Details: Pt continues to be heavily reliant on the walker, but was able to maintain better posture, step length and more consistant cadence than previous ambulation bouts.  He fatigues with the effort, but was highly motivated t/o ambulatio.    Stairs            Wheelchair Mobility    Modified Rankin (Stroke Patients Only)       Balance                                     Cognition Arousal/Alertness: Awake/alert Behavior During Therapy: WFL for tasks assessed/performed Overall Cognitive Status: Within Functional Limits for tasks assessed                      Exercises Total Joint Exercises Quad Sets: Strengthening;10 reps Gluteal Sets: Strengthening;10 reps Heel Slides: Strengthening;AROM;10 reps Hip ABduction/ADduction: Strengthening;10 reps Long Arc Quad: AAROM;AROM;10 reps Knee Flexion: PROM;5 reps;Seated Goniometric ROM: 3-80    General Comments        Pertinent Vitals/Pain Pain Score: 5  (increases to 9/10 with ROM and WBing acts) Pain Intervention(s): Patient requesting pain meds-RN notified    Home Living                      Prior Function            PT Goals (current goals can now be found in the care plan section) Progress towards PT goals: Progressing toward goals    Frequency  BID    PT Plan Current plan remains appropriate    Co-evaluation             End of Session Equipment Utilized During Treatment: Gait belt Activity Tolerance: Patient  limited by fatigue;Patient limited by pain Patient left: with chair alarm set;with call bell/phone within reach;with family/visitor present     Time: [id number] PT Time Calculation (min) (ACUTE ONLY): 27 min  Charges:  $Gait Training: 8-22 mins $Therapeutic Exercise: 8-22 mins                    G Codes:     Wayne Both, PT, DPT [phone]  Kreg Shropshire 09/18/2015, 12:53 PM

## 2015-09-18 NOTE — Discharge Summary (Signed)
Physician Discharge Summary  Subjective: 3 Days Post-Op Procedure(s) (LRB): TOTAL KNEE ARTHROPLASTY (Left) Patient reports pain as moderate.   Patient seen in rounds with Dr. Roland Rack. Patient is well, and has had no acute complaints or problems Patient is ready to go to rehabilitation for physical therapy.  Physician Discharge Summary  Patient ID: SHAKIEL COZINE MRN: [id number] DOB/AGE: 70-Apr-1947 70 y.o.  Admit date: 09/15/2015 Discharge date: 09/18/2015  Admission Diagnoses:  Discharge Diagnoses:  Active Problems:   Primary osteoarthritis of left knee   Discharged Condition: fair  Hospital Course: The patient is postop day 3 from a left total knee replacement done by Dr. Rudene Christians.  The patient has gone slowly with physical therapy and rehabilitation. The patient is ambulating 80 feet and has been working on range of motion. He is still lacking some extension. The patient has had a wound VAC on his wound since surgery. The patient has had a bowel movement. He has normal labs. He is going to rehabilitation today.  Treatments: surgery:  TOTAL KNEE ARTHROPLASTY (Left)  SURGEON: Laurene Footman, MD  ASSISTANTS: Rachelle Hora [facility]  ANESTHESIA: spinal  EBL: Total I/O In: -  Out: 150 [Blood:150]  BLOOD ADMINISTERED:none  DRAINS: Wound VAC over incision   LOCAL MEDICATIONS USED: MARCAINE and OTHER Exparel morphine and Toradol  SPECIMEN: Source of Specimen: Cut ends of bone  DISPOSITION OF SPECIMEN: PATHOLOGY  COUNTS: YES  TOURNIQUET: * Missing tourniquet times found for documented tourniquets in log: [id number] * 37 minutes at 300 mmHg  IMPLANTS: Medacta GMK sphere left 5 femur 4 tibia with 10 mm insert and 3 patella all components cemented  Discharge Exam: Blood pressure 120/61, pulse 93, temperature 98.4 F (36.9 C), temperature source Oral, resp. rate 20, height 5\' 11"  (1.803 m), weight 93.895 kg (207 lb), SpO2 93 %.   Disposition:      Medication List     TAKE these medications        acetaminophen 500 MG tablet  Commonly known as:  TYLENOL  Take 500 mg by mouth every 6 (six) hours as needed.     aspirin 325 MG tablet  Take 650 mg by mouth daily.     atorvastatin 10 MG tablet  Commonly known as:  LIPITOR  Take 5 mg by mouth daily at 6 PM.     enoxaparin 40 MG/0.4ML injection  Commonly known as:  LOVENOX  Inject 0.4 mLs (40 mg total) into the skin daily.     levothyroxine 100 MCG tablet  Commonly known as:  SYNTHROID, LEVOTHROID  Take 100 mcg by mouth daily before breakfast.     lisinopril 10 MG tablet  Commonly known as:  PRINIVIL,ZESTRIL  Take 10 mg by mouth at bedtime.     multivitamin-iron-minerals-folic acid chewable tablet  Chew 1 tablet by mouth daily.     ondansetron 4 MG tablet  Commonly known as:  ZOFRAN  Take 1 tablet (4 mg total) by mouth every 6 (six) hours as needed for nausea.     oxyCODONE 5 MG immediate release tablet  Commonly known as:  Oxy IR/ROXICODONE  Take 1-2 tablets (5-10 mg total) by mouth every 3 (three) hours as needed for breakthrough pain.     zolpidem 5 MG tablet  Commonly known as:  AMBIEN  Take 5 mg by mouth at bedtime as needed for sleep.           Follow-up Information    Follow up with MENZ,MICHAEL, MD In 1 week.  Specialty:  Orthopedic Surgery   Why:  For wound re-check   Contact information:   [place] [address] [phone]       Follow up with [facility] .   Specialty:  [place] information:   [place] [phone]      Signed: Prescott Parma, [place] 09/18/2015, 6:43 AM   Objective: Vital signs in last 24 hours: Temp:  [98.4 F (36.9 C)-100.2 F (37.9 C)] 98.4 F (36.9 C) (04/23 0420) Pulse Rate:  [86-105] 93 (04/23 0420) Resp:  [18-20] 20 (04/23 0420) BP: (120-136)/(61-77) 120/61 mmHg (04/23 0420) SpO2:  [88  %-95 %] 93 % (04/23 0420)  Intake/Output from previous day:  Intake/Output Summary (Last 24 hours) at 09/18/15 0643 Last data filed at 09/18/15 0100  Gross per 24 hour  Intake    480 ml  Output    400 ml  Net     80 ml    Intake/Output this shift: Total I/O In: -  Out: 400 [Urine:400]  Labs:  Recent Labs  09/15/15 1105 09/16/15 0453 09/17/15 0326  HGB 13.3 11.8* 11.5*    Recent Labs  09/16/15 0453 09/17/15 0326  WBC 5.2 5.7  RBC 3.80* 3.64*  HCT 35.2* 34.0*  PLT 135* 135*    Recent Labs  09/16/15 0453 09/17/15 0326  NA 138 134*  K 4.3 4.6  CL 107 100*  CO2 28 27  BUN 11 9  CREATININE 0.79 0.75  GLUCOSE 103* 122*  CALCIUM 8.3* 8.4*   No results for input(s): LABPT, INR in the last 72 hours.  EXAM: General - Patient is Alert and Oriented Extremity - Neurovascular intact Dorsiflexion/Plantar flexion intact No cellulitis present Compartment soft Incision - clean, dry, serous drainage, with removal of the wound VAC and application of a new lighter wound VAC. Motor Function -  the patient ambulated 40 feet yesterday with physical therapy. He has been moving around slowly in the room. He is able to dorsiflex and plantarflex comfortably.  Assessment/Plan: 3 Days Post-Op Procedure(s) (LRB): TOTAL KNEE ARTHROPLASTY (Left) Procedure(s) (LRB): TOTAL KNEE ARTHROPLASTY (Left) Past Medical History  Diagnosis Date  . Celiac disease   . Polio   . Hypertension   . Arthritis    Active Problems:   Primary osteoarthritis of left knee  Estimated body mass index is 28.88 kg/(m^2) as calculated from the following:   Height as of this encounter: 5\' 11"  (1.803 m).   Weight as of this encounter: 93.895 kg (207 lb). Discharge to SNF Diet - Regular diet Follow up - in 7 weeks Activity - WBAT Disposition - Rehab Condition Upon Discharge - Stable DVT Prophylaxis - Lovenox and TED hose  Reche Dixon, PA-C Orthopaedic Surgery 09/18/2015, 6:43 AM

## 2015-09-18 NOTE — Progress Notes (Signed)
   Subjective: 3 Days Post-Op Procedure(s) (LRB): TOTAL KNEE ARTHROPLASTY (Left) Patient reports pain as 4 on 0-10 scale.   Patient is well, and has had no acute complaints or problems. This is doing a little better. Denies any CP, SOB, ABD pain. We will continue therapy today.  Plan is to go to rehabilitation after hospital stay. The plan is to go home on Sunday.  Objective: Vital signs in last 24 hours: Temp:  [98.4 F (36.9 C)-100.2 F (37.9 C)] 98.4 F (36.9 C) (04/23 0420) Pulse Rate:  [86-105] 93 (04/23 0420) Resp:  [18-20] 20 (04/23 0420) BP: (120-136)/(61-77) 120/61 mmHg (04/23 0420) SpO2:  [88 %-95 %] 93 % (04/23 0420)  Intake/Output from previous day: 04/22 0701 - 04/23 0700 In: 480 [P.O.:480] Out: 400 [Urine:400] Intake/Output this shift: Total I/O In: -  Out: 400 [Urine:400]   Recent Labs  09/15/15 1105 09/16/15 0453 09/17/15 0326  HGB 13.3 11.8* 11.5*    Recent Labs  09/16/15 0453 09/17/15 0326  WBC 5.2 5.7  RBC 3.80* 3.64*  HCT 35.2* 34.0*  PLT 135* 135*    Recent Labs  09/16/15 0453 09/17/15 0326  NA 138 134*  K 4.3 4.6  CL 107 100*  CO2 28 27  BUN 11 9  CREATININE 0.79 0.75  GLUCOSE 103* 122*  CALCIUM 8.3* 8.4*   No results for input(s): LABPT, INR in the last 72 hours.  EXAM General - Patient is Alert, Appropriate and Oriented Extremity - Neurovascular intact Sensation intact distally Intact pulses distally Dorsiflexion/Plantar flexion intact Dressing - the wound VAC was removed without complication. The wound appeared to be drying clean. A new Provena wound VAC was applied. Motor Function - intact, moving foot and toes well on exam. The patient ambulated 40 feet with physical therapy yesterday.  Past Medical History  Diagnosis Date  . Celiac disease   . Polio   . Hypertension   . Arthritis     Assessment/Plan:   3 Days Post-Op Procedure(s) (LRB): TOTAL KNEE ARTHROPLASTY (Left) Active Problems:   Primary  osteoarthritis of left knee  Acute post op blood loss anemia    Estimated body mass index is 28.88 kg/(m^2) as calculated from the following:   Height as of this encounter: 5\' 11"  (1.803 m).   Weight as of this encounter: 93.895 kg (207 lb). Advance diet Up with therapy  The patient had a positive bowel movement. Patient will use a wound VAC daily in rehabilitation. Patient will need to follow up with [facility] in 7-8 days for dressing change.   DVT Prophylaxis - Lovenox, Foot Pumps and TED hose Weight-Bearing as tolerated to left leg   Reche Dixon, PA-C [place] 09/18/2015, 6:39 AM

## 2015-09-18 NOTE — Clinical Social Work Placement (Signed)
   CLINICAL SOCIAL WORK PLACEMENT  NOTE  Date:  09/18/2015  Patient Details  Name: Curtis Carroll MRN: [id number] Date of Birth: 1945-06-16  Clinical Social Work is seeking post-discharge placement for this patient at the [place] level of care (*CSW will initial, date and re-position this form in  chart as items are completed):  Yes   Patient/family provided with [facility] Work Department's list of facilities offering this level of care within the geographic area requested by the patient (or if unable, by the patient's family).  Yes   Patient/family informed of their freedom to choose among providers that offer the needed level of care, that participate in Medicare, Medicaid or managed care program needed by the patient, have an available bed and are willing to accept the patient.  Yes   Patient/family informed of [facility]'s ownership interest in [facility] and [facility], as well as of the fact that they are under no obligation to receive care at these facilities.  PASRR submitted to EDS on 09/16/15     PASRR number received on 09/16/15     Existing PASRR number confirmed on       FL2 transmitted to all facilities in geographic area requested by pt/family on 09/16/15     FL2 transmitted to all facilities within larger geographic area on       Patient informed that his/her managed care company has contracts with or will negotiate with certain facilities, including the following:        Yes   Patient/family informed of bed offers received.  Patient chooses bed at  [facility] )     Physician recommends and patient chooses bed at      Patient to be transferred to [facility] on 09/18/15.  Patient to be transferred to facility by [facility]     Patient family notified on 09/18/15 of transfer.  Name of family member notified:  Pt and wife.      PHYSICIAN       Additional Comment:     _______________________________________________ Darden Dates, LCSW 09/18/2015, 10:45 AM

## 2015-09-18 NOTE — Progress Notes (Signed)
Report given to Elmyra Ricks, at [facility] for transfer. EMS called for pick up and transfer.

## 2015-09-19 LAB — SURGICAL PATHOLOGY

## 2017-03-22 ENCOUNTER — Encounter: Payer: Self-pay | Admitting: *Deleted

## 2017-03-25 ENCOUNTER — Ambulatory Visit
Admission: RE | Admit: 2017-03-25 | Discharge: 2017-03-25 | Disposition: A | Discharge status: Home / Self Care | Payer: Federal, State, Local not specified - PPO | Source: Ambulatory Visit | Attending: Gastroenterology | Admitting: Gastroenterology

## 2017-03-25 ENCOUNTER — Ambulatory Visit: Payer: Federal, State, Local not specified - PPO | Admitting: Anesthesiology

## 2017-03-25 ENCOUNTER — Encounter
Admission: RE | Disposition: A | Discharge status: Home / Self Care | Payer: Self-pay | Source: Ambulatory Visit | Attending: Gastroenterology

## 2017-03-25 DIAGNOSIS — D124 Benign neoplasm of descending colon: Secondary | ICD-10-CM | POA: Insufficient documentation

## 2017-03-25 DIAGNOSIS — Z79891 Long term (current) use of opiate analgesic: Secondary | ICD-10-CM | POA: Diagnosis not present

## 2017-03-25 DIAGNOSIS — D123 Benign neoplasm of transverse colon: Secondary | ICD-10-CM | POA: Diagnosis not present

## 2017-03-25 DIAGNOSIS — Z880 Allergy status to penicillin: Secondary | ICD-10-CM | POA: Insufficient documentation

## 2017-03-25 DIAGNOSIS — K900 Celiac disease: Secondary | ICD-10-CM | POA: Insufficient documentation

## 2017-03-25 DIAGNOSIS — Z09 Encounter for follow-up examination after completed treatment for conditions other than malignant neoplasm: Secondary | ICD-10-CM | POA: Diagnosis present

## 2017-03-25 DIAGNOSIS — Z888 Allergy status to other drugs, medicaments and biological substances status: Secondary | ICD-10-CM | POA: Diagnosis not present

## 2017-03-25 DIAGNOSIS — I10 Essential (primary) hypertension: Secondary | ICD-10-CM | POA: Insufficient documentation

## 2017-03-25 DIAGNOSIS — D120 Benign neoplasm of cecum: Secondary | ICD-10-CM | POA: Diagnosis not present

## 2017-03-25 DIAGNOSIS — Z8719 Personal history of other diseases of the digestive system: Secondary | ICD-10-CM | POA: Diagnosis not present

## 2017-03-25 DIAGNOSIS — Z79899 Other long term (current) drug therapy: Secondary | ICD-10-CM | POA: Diagnosis not present

## 2017-03-25 DIAGNOSIS — J449 Chronic obstructive pulmonary disease, unspecified: Secondary | ICD-10-CM | POA: Insufficient documentation

## 2017-03-25 DIAGNOSIS — D122 Benign neoplasm of ascending colon: Secondary | ICD-10-CM | POA: Diagnosis not present

## 2017-03-25 DIAGNOSIS — D125 Benign neoplasm of sigmoid colon: Secondary | ICD-10-CM | POA: Diagnosis not present

## 2017-03-25 DIAGNOSIS — Z7982 Long term (current) use of aspirin: Secondary | ICD-10-CM | POA: Diagnosis not present

## 2017-03-25 DIAGNOSIS — K648 Other hemorrhoids: Secondary | ICD-10-CM | POA: Insufficient documentation

## 2017-03-25 DIAGNOSIS — K5730 Diverticulosis of large intestine without perforation or abscess without bleeding: Secondary | ICD-10-CM | POA: Insufficient documentation

## 2017-03-25 HISTORY — PX: COLONOSCOPY WITH PROPOFOL: SHX5780

## 2017-03-25 LAB — CBC WITH DIFFERENTIAL/PLATELET
BASOS PCT: 1 %
Basophils Absolute: 0 10*3/uL (ref 0–0.1)
Eosinophils Absolute: 0.1 10*3/uL (ref 0–0.7)
Eosinophils Relative: 1 %
HEMATOCRIT: 48 % (ref 40.0–52.0)
Hemoglobin: 15.9 g/dL (ref 13.0–18.0)
Lymphocytes Relative: 17 %
Lymphs Abs: 0.9 10*3/uL — ABNORMAL LOW (ref 1.0–3.6)
MCH: 32 pg (ref 26.0–34.0)
MCHC: 33.2 g/dL (ref 32.0–36.0)
MCV: 96.5 fL (ref 80.0–100.0)
MONO ABS: 0.4 10*3/uL (ref 0.2–1.0)
MONOS PCT: 8 %
NEUTROS ABS: 3.6 10*3/uL (ref 1.4–6.5)
Neutrophils Relative %: 73 %
Platelets: 178 10*3/uL (ref 150–440)
RBC: 4.98 MIL/uL (ref 4.40–5.90)
RDW: 13.7 % (ref 11.5–14.5)
WBC: 4.9 10*3/uL (ref 3.8–10.6)

## 2017-03-25 LAB — PROTIME-INR
INR: 1.06
Prothrombin Time: 13.7 seconds (ref 11.4–15.2)

## 2017-03-25 SURGERY — COLONOSCOPY WITH PROPOFOL
Anesthesia: General

## 2017-03-25 MED ORDER — CLINDAMYCIN PHOSPHATE 600 MG/50ML IV SOLN
600.0000 mg | Freq: Once | INTRAVENOUS | Status: AC
  Administered 2017-03-25: 600 mg via INTRAVENOUS
  Filled 2017-03-25: qty 50

## 2017-03-25 MED ORDER — SODIUM CHLORIDE 0.9 % IV SOLN
INTRAVENOUS | Status: DC
  Administered 2017-03-25: 11:00:00 via INTRAVENOUS
  Administered 2017-03-25: 1000 mL via INTRAVENOUS

## 2017-03-25 MED ORDER — EPHEDRINE SULFATE 50 MG/ML IJ SOLN
INTRAMUSCULAR | Status: DC | PRN
  Administered 2017-03-25 (×2): 5 mg via INTRAVENOUS

## 2017-03-25 MED ORDER — PROPOFOL 500 MG/50ML IV EMUL
INTRAVENOUS | Status: DC | PRN
  Administered 2017-03-25: 120 ug/kg/min via INTRAVENOUS

## 2017-03-25 MED ORDER — LIDOCAINE HCL (PF) 2 % IJ SOLN
INTRAMUSCULAR | Status: AC
  Filled 2017-03-25: qty 10

## 2017-03-25 MED ORDER — PROPOFOL 500 MG/50ML IV EMUL
INTRAVENOUS | Status: AC
  Filled 2017-03-25: qty 50

## 2017-03-25 MED ORDER — PROPOFOL 10 MG/ML IV BOLUS
INTRAVENOUS | Status: DC | PRN
  Administered 2017-03-25: 50 mg via INTRAVENOUS

## 2017-03-25 MED ORDER — SODIUM CHLORIDE 0.9 % IV SOLN: INTRAVENOUS | Status: DC

## 2017-03-25 MED ORDER — LIDOCAINE HCL (CARDIAC) 20 MG/ML IV SOLN
INTRAVENOUS | Status: DC | PRN
  Administered 2017-03-25: 50 mg via INTRAVENOUS
  Administered 2017-03-25: 100 mg via INTRAVENOUS

## 2017-03-25 MED ORDER — EPHEDRINE SULFATE 50 MG/ML IJ SOLN
INTRAMUSCULAR | Status: AC
  Filled 2017-03-25: qty 1

## 2017-03-25 MED ORDER — PHENYLEPHRINE HCL 10 MG/ML IJ SOLN
INTRAMUSCULAR | Status: DC | PRN
  Administered 2017-03-25: 100 ug via INTRAVENOUS

## 2017-03-25 NOTE — Anesthesia Postprocedure Evaluation (Signed)
Anesthesia Post Note  Patient: Curtis Carroll  Procedure(s) Performed: COLONOSCOPY WITH PROPOFOL (N/A )  Patient location during evaluation: PACU Anesthesia Type: General Level of consciousness: awake Pain management: pain level controlled Vital Signs Assessment: post-procedure vital signs reviewed and stable Respiratory status: spontaneous breathing Cardiovascular status: stable Anesthetic complications: no     Last Vitals:  Vitals:   03/25/17 1230 03/25/17 1240  BP: 112/74 110/73  Pulse: 60   Resp: 16   Temp:    SpO2:      Last Pain:  Vitals:   03/25/17 1220  TempSrc: Tympanic                 VAN STAVEREN,Zamzam Whinery

## 2017-03-25 NOTE — Transfer of Care (Signed)
Immediate Anesthesia Transfer of Care Note  Patient: Curtis Carroll  Procedure(s) Performed: COLONOSCOPY WITH PROPOFOL (N/A )  Patient Location: PACU and Endoscopy Unit  Anesthesia Type:General  Level of Consciousness: awake, oriented and patient cooperative  Airway & Oxygen Therapy: Patient Spontanous Breathing and Patient connected to nasal cannula oxygen  Post-op Assessment: Report given to RN, Post -op Vital signs reviewed and stable and Patient moving all extremities  Post vital signs: Reviewed and stable  Last Vitals:  Vitals:   03/25/17 0945  BP: 108/74  Pulse: 74  Resp: 16  Temp: 36.5 C  SpO2: 100%    Last Pain:  Vitals:   03/25/17 0945  TempSrc: Tympanic         Complications: No apparent anesthesia complications

## 2017-03-25 NOTE — Anesthesia Preprocedure Evaluation (Signed)
Anesthesia Evaluation  Patient identified by MRN, date of birth, ID band Patient awake    Reviewed: Allergy & Precautions, NPO status , Patient's Chart, lab work & pertinent test results  Airway Mallampati: I       Dental  (+) Teeth Intact   Pulmonary COPD, Current Smoker,     + decreased breath sounds      Cardiovascular Exercise Tolerance: Good hypertension, Pt. on medications  Rhythm:Regular Rate:Normal     Neuro/Psych negative psych ROS   GI/Hepatic negative GI ROS, Neg liver ROS,   Endo/Other  negative endocrine ROS  Renal/GU negative Renal ROS     Musculoskeletal   Abdominal Normal abdominal exam  (+)   Peds negative pediatric ROS (+)  Hematology negative hematology ROS (+)   Anesthesia Other Findings   Reproductive/Obstetrics                             Anesthesia Physical Anesthesia Plan  ASA: II  Anesthesia Plan: General   Post-op Pain Management:    Induction: Intravenous  PONV Risk Score and Plan: 0  Airway Management Planned: Natural Airway and Nasal Cannula  Additional Equipment:   Intra-op Plan:   Post-operative Plan:   Informed Consent: I have reviewed the patients History and Physical, chart, labs and discussed the procedure including the risks, benefits and alternatives for the proposed anesthesia with the patient or authorized representative who has indicated his/her understanding and acceptance.     Plan Discussed with: CRNA  Anesthesia Plan Comments:         Anesthesia Quick Evaluation

## 2017-03-25 NOTE — OR Nursing (Signed)
Normal Saline Lift done to Cecal Colon Polyp. 15 ml normal saline injected.

## 2017-03-25 NOTE — H&P (Addendum)
Outpatient short stay form Pre-procedure 03/25/2017 10:55 AM Lollie Sails MD  Primary Physician: Dr Belinda Fisher  Reason for visit:  Colonoscopy  History of present illness:  Patient is a 71 year old male presenting today is above. His personal history of adenomatous colon polyps. His last colonoscopy was 10/01/2011 with multiple polyps removed at that time all of these being consistent with hyperplastic. Previously it had multiple large adenomas removed. He tolerated his prep well. It has been about a week since he took his daily aspirin. It is of note that he takes a high-dose of aspirin one to 2 325 milligram aspirin daily due to leg pain. He states however he has not taken any aspirin for at least a week.  Current Facility-Administered Medications:  .  0.9 %  sodium chloride infusion, , Intravenous, Continuous, Lollie Sails, MD, Last Rate: 20 mL/hr at 03/25/17 1024, 1,000 mL at 03/25/17 1024 .  0.9 %  sodium chloride infusion, , Intravenous, Continuous, Lollie Sails, MD .  clindamycin (CLEOCIN) IVPB 600 mg, 600 mg, Intravenous, Once, Lollie Sails, MD, Last Rate: 100 mL/hr at 03/25/17 1027, 600 mg at 03/25/17 1027  Prescriptions Prior to Admission  Medication Sig Dispense Refill Last Dose  . acetaminophen (TYLENOL) 500 MG tablet Take 500 mg by mouth every 6 (six) hours as needed.   Past Week at Unknown time  . aspirin 325 MG tablet Take 650 mg by mouth daily.   Past Week at Unknown time  . atorvastatin (LIPITOR) 10 MG tablet Take 5 mg by mouth daily at 6 PM.   Past Week at Unknown time  . enoxaparin (LOVENOX) 40 MG/0.4ML injection Inject 0.4 mLs (40 mg total) into the skin daily. 14 Syringe 0 Past Week at Unknown time  . Eszopiclone (ESZOPICLONE) 3 MG TABS Take 3 mg by mouth at bedtime. Take immediately before bedtime     . levothyroxine (SYNTHROID, LEVOTHROID) 100 MCG tablet Take 100 mcg by mouth daily before breakfast.   03/24/2017 at Unknown time  .  multivitamin-iron-minerals-folic acid (CENTRUM) chewable tablet Chew 1 tablet by mouth daily.   Past Week at Unknown time  . ondansetron (ZOFRAN) 4 MG tablet Take 1 tablet (4 mg total) by mouth every 6 (six) hours as needed for nausea. 20 tablet 0 Past Week at Unknown time  . oxyCODONE (OXY IR/ROXICODONE) 5 MG immediate release tablet Take 1-2 tablets (5-10 mg total) by mouth every 3 (three) hours as needed for breakthrough pain. 60 tablet 0 Past Week at Unknown time  . zolpidem (AMBIEN) 5 MG tablet Take 5 mg by mouth at bedtime as needed for sleep.   Past Week at Unknown time  . lisinopril (PRINIVIL,ZESTRIL) 10 MG tablet Take 10 mg by mouth at bedtime.   09/14/2015 at pm     Allergies  Allergen Reactions  . Black Pepper [Piper] Diarrhea  . Codeine Nausea Only  . Lactose Intolerance (Gi) Other (See Comments)    Bloating   . Penicillins Nausea Only  . Wheat Bran Other (See Comments)    Bloating      Past Medical History:  Diagnosis Date  . Arthritis   . Celiac disease   . Hypertension   . Polio     Review of systems:      Physical Exam    Heart and lungs: Regular rate and rhythm without rub or gallop, lungs are bilaterally clear.    HEENT: Normocephalic atraumatic eyes are anicteric    Other:  Pertinant exam for procedure: Soft nontender nondistended bowel sounds positive normoactive.    Planned proceedures: Colonoscopy and indicated procedures. I have discussed the risks benefits and complications of procedures to include not limited to bleeding, infection, perforation and the risk of sedation and the patient wishes to proceed.    Lollie Sails, MD Gastroenterology 03/25/2017  10:55 AM

## 2017-03-25 NOTE — Anesthesia Post-op Follow-up Note (Signed)
Anesthesia QCDR form completed.        

## 2017-03-25 NOTE — Op Note (Addendum)
[facility] Gastroenterology Patient Name: Curtis Carroll Procedure Date: 03/25/2017 10:54 AM MRN: [id number] Account #: [id number] Date of Birth: [birth year] Admit Type: Outpatient Age: 71 Room: [facility] ENDO ROOM 3 Gender: Male Note Status: Finalized Procedure:            Colonoscopy Indications:          Personal history of colonic polyps Providers:            Lollie Sails, MD Referring MD:         Lenard Simmer, MD (Referring MD) Medicines:            Monitored Anesthesia Care Complications:        All polypectomy sites noted to have good hemostasis on                        removal of the scope. Procedure:            Pre-Anesthesia Assessment:                       - ASA Grade Assessment: II - A patient with mild                        systemic disease.                       After obtaining informed consent, the colonoscope was                        passed under direct vision. Throughout the procedure,                        the patient's blood pressure, pulse, and oxygen                        saturations were monitored continuously. The                        Colonoscope was introduced through the anus and                        advanced to the the cecum, identified by appendiceal                        orifice and ileocecal valve. The quality of the bowel                        preparation was good. Findings:      Multiple small-mouthed diverticula were found in the sigmoid colon,       descending colon and transverse colon.      A single large-mouthed diverticulum was found in the splenic flexure.       There was no evidence of diverticular bleeding.      A 2 mm polyp was found in the proximal sigmoid colon. The polyp was       sessile. The polyp was removed with a cold biopsy forceps. Resection and       retrieval were complete.      A 4 mm polyp was found in the proximal descending colon. The polyp was       sessile. The polyp was removed  with a cold biopsy forceps.  Resection and       retrieval were complete.      Two sessile polyps were found in the hepatic flexure. The polyps were 2       to 3 mm in size. These polyps were removed with a cold biopsy forceps.       Resection and retrieval were complete.      A 1 mm polyp was found in the cecum. The polyp was sessile. The polyp       was removed with a cold biopsy forceps. Resection and retrieval were       complete.      A 34 mm polyp was found in the proximal ascending colon. The polyp was       multi-lobulated and sessile. The polyp was removed with a cold biopsy       forceps, The polyp was removed with a cold snare, The polyp was removed       with a saline injection-lift technique using a cold snare and The polyp       was removed with a piecemeal technique using a cold snare. Resection and       retrieval were complete using a suction (via the working channel). Four       hemostatic clips were successfully placed (MR conditional). No bleeding       at end of procedure.      Two sessile polyps were found in the proximal ascending colon. The       polyps were 2 to 3 mm in size. These polyps were removed with a cold       biopsy forceps. Resection and retrieval were complete.      A 4 mm polyp was found in the hepatic flexure. The polyp was sessile.       The polyp was removed with a cold snare. Resection and retrieval were       complete.      Non-bleeding internal hemorrhoids were found during retroflexion and       during anoscopy. The hemorrhoids were small and Grade I (internal       hemorrhoids that do not prolapse).      The digital rectal exam was normal othwerwise. Impression:           - Diverticulosis in the sigmoid colon, in the                        descending colon and in the transverse colon.                       - Diverticulosis at the splenic flexure. There was no                        evidence of diverticular bleeding.                       -  One 2 mm polyp in the proximal sigmoid colon, removed                        with a cold biopsy forceps. Resected and retrieved.                       - One 4 mm polyp in the proximal descending colon,  removed with a cold biopsy forceps. Resected and                        retrieved.                       - Two 2 to 3 mm polyps at the hepatic flexure, removed                        with a cold biopsy forceps. Resected and retrieved.                       - One 1 mm polyp in the cecum, removed with a cold                        biopsy forceps. Resected and retrieved.                       - One 34 mm polyp in the proximal ascending colon, on                        the opposite side of the lumen from the IC valve,                        removed with a cold biopsy forceps, removed with a cold                        snare, removed using injection-lift and a cold snare                        and removed piecemeal using a cold snare. Resected and                        retrieved. Clips (MR conditional) were placed.                       - Two 2 to 3 mm polyps in the proximal ascending colon,                        removed with a cold biopsy forceps. Resected and                        retrieved.                       - One 4 mm polyp at the hepatic flexure, removed with a                        cold snare. Resected and retrieved. Recommendation:       - Discharge patient to home.                       - No aspirin, ibuprofen, naproxen, or other                        non-steroidal anti-inflammatory drugs for 10 days after                        polyp removal.                       -  Telephone GI clinic for pathology results in 1 week. Lollie Sails, MD 03/25/2017 12:23:07 PM This report has been signed electronically. Number of Addenda: 0 Note Initiated On: 03/25/2017 10:54 AM Scope Withdrawal Time: 0 hours 42 minutes 17 seconds  Total Procedure Duration: 1 hour 1  minute 15 seconds       [facility]

## 2017-03-26 LAB — SURGICAL PATHOLOGY

## 2017-03-27 ENCOUNTER — Encounter: Payer: Self-pay | Admitting: Gastroenterology

## 2018-12-29 ENCOUNTER — Other Ambulatory Visit: Payer: Self-pay

## 2018-12-29 ENCOUNTER — Other Ambulatory Visit
Admission: RE | Admit: 2018-12-29 | Discharge: 2018-12-29 | Disposition: A | Discharge status: Home / Self Care | Payer: Medicare Other | Source: Ambulatory Visit | Attending: Gastroenterology | Admitting: Gastroenterology

## 2018-12-29 DIAGNOSIS — Z20828 Contact with and (suspected) exposure to other viral communicable diseases: Secondary | ICD-10-CM | POA: Insufficient documentation

## 2018-12-29 DIAGNOSIS — Z01812 Encounter for preprocedural laboratory examination: Secondary | ICD-10-CM | POA: Insufficient documentation

## 2018-12-29 LAB — SARS CORONAVIRUS 2 (TAT 6-24 HRS): SARS Coronavirus 2: NEGATIVE

## 2018-12-31 ENCOUNTER — Encounter: Payer: Self-pay | Admitting: *Deleted

## 2019-01-01 ENCOUNTER — Encounter
Admission: RE | Disposition: A | Discharge status: Home / Self Care | Payer: Self-pay | Source: Home / Self Care | Attending: Gastroenterology

## 2019-01-01 ENCOUNTER — Other Ambulatory Visit: Payer: Self-pay

## 2019-01-01 ENCOUNTER — Ambulatory Visit
Admission: RE | Admit: 2019-01-01 | Discharge: 2019-01-01 | Disposition: A | Discharge status: Home / Self Care | Payer: Medicare Other | Attending: Gastroenterology | Admitting: Gastroenterology

## 2019-01-01 ENCOUNTER — Encounter: Payer: Self-pay | Admitting: *Deleted

## 2019-01-01 ENCOUNTER — Ambulatory Visit: Payer: Medicare Other | Admitting: Anesthesiology

## 2019-01-01 DIAGNOSIS — Z8612 Personal history of poliomyelitis: Secondary | ICD-10-CM | POA: Diagnosis not present

## 2019-01-01 DIAGNOSIS — K900 Celiac disease: Secondary | ICD-10-CM | POA: Insufficient documentation

## 2019-01-01 DIAGNOSIS — K621 Rectal polyp: Secondary | ICD-10-CM | POA: Insufficient documentation

## 2019-01-01 DIAGNOSIS — Z7982 Long term (current) use of aspirin: Secondary | ICD-10-CM | POA: Diagnosis not present

## 2019-01-01 DIAGNOSIS — Z86010 Personal history of colonic polyps: Secondary | ICD-10-CM | POA: Diagnosis not present

## 2019-01-01 DIAGNOSIS — F17210 Nicotine dependence, cigarettes, uncomplicated: Secondary | ICD-10-CM | POA: Diagnosis not present

## 2019-01-01 DIAGNOSIS — Z79890 Hormone replacement therapy (postmenopausal): Secondary | ICD-10-CM | POA: Insufficient documentation

## 2019-01-01 DIAGNOSIS — Z79899 Other long term (current) drug therapy: Secondary | ICD-10-CM | POA: Insufficient documentation

## 2019-01-01 DIAGNOSIS — D123 Benign neoplasm of transverse colon: Secondary | ICD-10-CM | POA: Diagnosis not present

## 2019-01-01 DIAGNOSIS — E785 Hyperlipidemia, unspecified: Secondary | ICD-10-CM | POA: Diagnosis not present

## 2019-01-01 DIAGNOSIS — I10 Essential (primary) hypertension: Secondary | ICD-10-CM | POA: Diagnosis not present

## 2019-01-01 DIAGNOSIS — K5730 Diverticulosis of large intestine without perforation or abscess without bleeding: Secondary | ICD-10-CM | POA: Diagnosis not present

## 2019-01-01 DIAGNOSIS — K648 Other hemorrhoids: Secondary | ICD-10-CM | POA: Insufficient documentation

## 2019-01-01 DIAGNOSIS — D125 Benign neoplasm of sigmoid colon: Secondary | ICD-10-CM | POA: Insufficient documentation

## 2019-01-01 DIAGNOSIS — D122 Benign neoplasm of ascending colon: Secondary | ICD-10-CM | POA: Insufficient documentation

## 2019-01-01 DIAGNOSIS — J449 Chronic obstructive pulmonary disease, unspecified: Secondary | ICD-10-CM | POA: Insufficient documentation

## 2019-01-01 DIAGNOSIS — M1990 Unspecified osteoarthritis, unspecified site: Secondary | ICD-10-CM | POA: Insufficient documentation

## 2019-01-01 DIAGNOSIS — E039 Hypothyroidism, unspecified: Secondary | ICD-10-CM | POA: Diagnosis not present

## 2019-01-01 HISTORY — PX: COLONOSCOPY WITH PROPOFOL: SHX5780

## 2019-01-01 HISTORY — DX: Hyperlipidemia, unspecified: E78.5

## 2019-01-01 LAB — CBC WITH DIFFERENTIAL/PLATELET
Abs Immature Granulocytes: 0.02 10*3/uL (ref 0.00–0.07)
Basophils Absolute: 0 10*3/uL (ref 0.0–0.1)
Basophils Relative: 0 %
Eosinophils Absolute: 0 10*3/uL (ref 0.0–0.5)
Eosinophils Relative: 0 %
HCT: 47.3 % (ref 39.0–52.0)
Hemoglobin: 16 g/dL (ref 13.0–17.0)
Immature Granulocytes: 0 %
Lymphocytes Relative: 12 %
Lymphs Abs: 0.9 10*3/uL (ref 0.7–4.0)
MCH: 31.6 pg (ref 26.0–34.0)
MCHC: 33.8 g/dL (ref 30.0–36.0)
MCV: 93.3 fL (ref 80.0–100.0)
Monocytes Absolute: 0.4 10*3/uL (ref 0.1–1.0)
Monocytes Relative: 6 %
Neutro Abs: 5.5 10*3/uL (ref 1.7–7.7)
Neutrophils Relative %: 82 %
Platelets: 199 10*3/uL (ref 150–400)
RBC: 5.07 MIL/uL (ref 4.22–5.81)
RDW: 13.1 % (ref 11.5–15.5)
WBC: 6.9 10*3/uL (ref 4.0–10.5)
nRBC: 0 % (ref 0.0–0.2)

## 2019-01-01 LAB — PROTIME-INR
INR: 1.1 (ref 0.8–1.2)
Prothrombin Time: 13.6 seconds (ref 11.4–15.2)

## 2019-01-01 SURGERY — COLONOSCOPY WITH PROPOFOL
Anesthesia: General

## 2019-01-01 MED ORDER — PHENYLEPHRINE HCL (PRESSORS) 10 MG/ML IV SOLN
INTRAVENOUS | Status: AC
  Filled 2019-01-01: qty 1

## 2019-01-01 MED ORDER — PROPOFOL 10 MG/ML IV BOLUS
INTRAVENOUS | Status: DC | PRN
  Administered 2019-01-01: 30 mg via INTRAVENOUS

## 2019-01-01 MED ORDER — GLYCOPYRROLATE 0.2 MG/ML IJ SOLN
INTRAMUSCULAR | Status: DC | PRN
  Administered 2019-01-01: 0.2 mg via INTRAVENOUS

## 2019-01-01 MED ORDER — EPHEDRINE SULFATE 50 MG/ML IJ SOLN
INTRAMUSCULAR | Status: AC
  Filled 2019-01-01: qty 1

## 2019-01-01 MED ORDER — FENTANYL CITRATE (PF) 100 MCG/2ML IJ SOLN: INTRAMUSCULAR | Status: DC | PRN

## 2019-01-01 MED ORDER — FENTANYL CITRATE (PF) 100 MCG/2ML IJ SOLN
INTRAMUSCULAR | Status: DC | PRN
  Administered 2019-01-01: 25 ug via INTRAVENOUS

## 2019-01-01 MED ORDER — EPHEDRINE SULFATE 50 MG/ML IJ SOLN
INTRAMUSCULAR | Status: DC | PRN
  Administered 2019-01-01: 10 mg via INTRAVENOUS
  Administered 2019-01-01: 5 mg via INTRAVENOUS

## 2019-01-01 MED ORDER — FENTANYL CITRATE (PF) 100 MCG/2ML IJ SOLN
INTRAMUSCULAR | Status: AC
  Filled 2019-01-01: qty 2

## 2019-01-01 MED ORDER — SODIUM CHLORIDE 0.9 % IV SOLN
INTRAVENOUS | Status: DC
  Administered 2019-01-01 (×2): via INTRAVENOUS

## 2019-01-01 MED ORDER — PHENYLEPHRINE HCL (PRESSORS) 10 MG/ML IV SOLN
INTRAVENOUS | Status: DC | PRN
  Administered 2019-01-01 (×4): 100 ug via INTRAVENOUS

## 2019-01-01 MED ORDER — PROPOFOL 500 MG/50ML IV EMUL
INTRAVENOUS | Status: DC | PRN
  Administered 2019-01-01: 150 ug/kg/min via INTRAVENOUS

## 2019-01-01 NOTE — Transfer of Care (Signed)
Immediate Anesthesia Transfer of Care Note  Patient: Curtis Carroll  Procedure(s) Performed: COLONOSCOPY WITH PROPOFOL (N/A )  Patient Location: PACU  Anesthesia Type:General  Level of Consciousness: awake  Airway & Oxygen Therapy: Patient Spontanous Breathing and Patient connected to face mask oxygen  Post-op Assessment: Report given to RN and Post -op Vital signs reviewed and stable  Post vital signs: Reviewed and stable  Last Vitals:  Vitals Value Taken Time  BP 99/63 01/01/19 1236  Temp    Pulse 84 01/01/19 1239  Resp 17 01/01/19 1239  SpO2 100 % 01/01/19 1239  Vitals shown include unvalidated device data.  Last Pain:  Vitals:   01/01/19 1236  TempSrc:   PainSc: Asleep         Complications: No apparent anesthesia complications

## 2019-01-01 NOTE — Op Note (Signed)
[facility] Gastroenterology Patient Name: Curtis Carroll Procedure Date: 01/01/2019 10:58 AM MRN: [id number] Account #: [id number] Date of Birth: 03-17-1946 Admit Type: Outpatient Age: 73 Room: [facility] ENDO ROOM 1 Gender: Male Note Status: Finalized Procedure:            Colonoscopy Indications:          Personal history of colonic polyps Providers:            Lollie Sails, MD Referring MD:         Lenard Simmer, MD (Referring MD) Medicines:            Monitored Anesthesia Care Complications:        No immediate complications. Procedure:            Pre-Anesthesia Assessment:                       - ASA Grade Assessment: III - A patient with severe                        systemic disease.                       After obtaining informed consent, the colonoscope was                        passed under direct vision. Throughout the procedure,                        the patient's blood pressure, pulse, and oxygen                        saturations were monitored continuously. The                        Colonoscope was introduced through the anus and                        advanced to the the cecum, identified by appendiceal                        orifice and ileocecal valve. The colonoscopy was                        performed with moderate difficulty. The patient                        tolerated the procedure well. The quality of the bowel                        preparation was good. Findings:      Multiple small and large-mouthed diverticula were found in the sigmoid       colon and descending colon.      A 18 mm polyp was found in the proximal ascending colon across from the       IC valve. The polyp was multi-lobulated. The polyp was removed with a       hot snare and cold forcep. Resection and retrieval were complete.      Five sessile polyps were found in the proximal ascending colon. The       polyps were 2 to 5 mm in size. These  polyps were removed  with a cold       snare and cold forcep. Resection and retrieval were complete.      A 4 mm polyp was found in the transverse colon. The polyp was sessile.       The polyp was removed with a cold snare. Resection and retrieval were       complete.      Four sessile polyps were found in the sigmoid colon. The polyps were 1       to 2 mm in size. These polyps were removed with a cold biopsy forceps.       Resection and retrieval were complete.      A 3 mm polyp was found in the rectum. The polyp was sessile. The polyp       was removed with a cold biopsy forceps. Resection and retrieval were       complete.      Non-bleeding internal hemorrhoids were found during retroflexion. The       hemorrhoids were small.      The exam was otherwise without abnormality.      The digital rectal exam was normal. Impression:           - Diverticulosis in the sigmoid colon and in the                        descending colon.                       - One 18 mm polyp in the proximal ascending colon,                        removed with a hot snare. Resected and retrieved.                       - Five 2 to 5 mm polyps in the proximal ascending                        colon, removed with a cold snare. Resected and                        retrieved.                       - One 4 mm polyp in the transverse colon, removed with                        a cold snare. Resected and retrieved.                       - Four 1 to 2 mm polyps in the sigmoid colon, removed                        with a cold biopsy forceps. Resected and retrieved.                       - One 3 mm polyp in the rectum, removed with a cold                        biopsy forceps. Resected and retrieved.                       -  Non-bleeding internal hemorrhoids.                       - The examination was otherwise normal. Recommendation:       - Discharge patient to home.                       - Clear liquid diet and full liquid diet today.                        - Soft diet for 2 days, then advance as tolerated to                        advance diet as tolerated. Procedure Code(s):    --- Professional ---                       [phone], Colonoscopy, flexible; with removal of tumor(s),                        polyp(s), or other lesion(s) by snare technique                       45380, 103, Colonoscopy, flexible; with biopsy, single                        or multiple Diagnosis Code(s):    --- Professional ---                       K64.8, Other hemorrhoids                       K63.5, Polyp of colon                       K62.1, Rectal polyp                       Z86.010, Personal history of colonic polyps                       K57.30, Diverticulosis of large intestine without                        perforation or abscess without bleeding CPT copyright 2019 [employer]. All rights reserved. The codes documented in this report are preliminary and upon coder review may  be revised to meet current compliance requirements. Lollie Sails, MD 01/01/2019 12:37:35 PM This report has been signed electronically. Number of Addenda: 0 Note Initiated On: 01/01/2019 10:58 AM Scope Withdrawal Time: 0 hours 11 minutes 12 seconds  Total Procedure Duration: 0 hours 57 minutes 49 seconds       [facility]

## 2019-01-01 NOTE — H&P (Signed)
Outpatient short stay form Pre-procedure 01/01/2019 11:05 AM Curtis Sails MD  Primary Physician: Dr. Belinda Fisher  Reason for visit: Colonoscopy  History of present illness: Patient is a 73 year old male presenting today for colonoscopy in regards to his personal history of adenomatous colon polyps.  On each colonoscopy has had multiple colon colon polyps.  His last when he had a polyp removed from proximal ascending colon.  Patient tolerated his prep well.  He takes no aspirin or blood thinning agent the exception of 81 mg aspirin that he has held since last week.  He takes no other blood thinning agents.    Current Facility-Administered Medications:  .  0.9 %  sodium chloride infusion, , Intravenous, Continuous, Curtis Sails, MD, Last Rate: 20 mL/hr at 01/01/19 0539  Medications Prior to Admission  Medication Sig Dispense Refill Last Dose  . acetaminophen (TYLENOL) 500 MG tablet Take 500 mg by mouth every 6 (six) hours as needed.   Past Week at Unknown time  . aspirin EC 81 MG tablet Take 81 mg by mouth daily.   Past Week at Unknown time  . atorvastatin (LIPITOR) 10 MG tablet Take 5 mg by mouth daily at 6 PM.   12/31/2018 at 1800  . Eszopiclone (ESZOPICLONE) 3 MG TABS Take 3 mg by mouth at bedtime. Take immediately before bedtime   Past Month at Unknown time  . levothyroxine (SYNTHROID, LEVOTHROID) 100 MCG tablet Take 100 mcg by mouth daily before breakfast.   12/31/2018 at 0600  . lisinopril (PRINIVIL,ZESTRIL) 10 MG tablet Take 10 mg by mouth at bedtime.   12/31/2018 at 1800  . multivitamin-iron-minerals-folic acid (CENTRUM) chewable tablet Chew 1 tablet by mouth daily.   Past Week at Unknown time  . ondansetron (ZOFRAN) 4 MG tablet Take 1 tablet (4 mg total) by mouth every 6 (six) hours as needed for nausea. 20 tablet 0 Past Month at Unknown time     Allergies  Allergen Reactions  . Alcohol-Sulfur [Sulfur]   . Black Pepper [Piper] Diarrhea  . Codeine Nausea Only  . Lactose  Intolerance (Gi) Other (See Comments)    Bloating   . Penicillins Nausea Only  . Wheat Bran Other (See Comments)    Bloating      Past Medical History:  Diagnosis Date  . Arthritis   . 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 Hyperlipidemia   . Hypertension   . Polio     Review of systems:      Physical Exam    Heart and lungs: Regular rate and rhythm without rub or gallop lungs are bilaterally clear    HEENT: Normocephalic atraumatic eyes are anicteric    Other:    Pertinant exam for procedure: Soft nontender nondistended bowel sounds positive normoactive    Planned proceedures: Colonoscopy and indicated procedures. I have discussed the risks benefits and complications of procedures to include not limited to bleeding, infection, perforation and the risk of sedation and the patient wishes to proceed.    Curtis Sails, MD Gastroenterology 01/01/2019  11:05 AM

## 2019-01-01 NOTE — Anesthesia Preprocedure Evaluation (Addendum)
Anesthesia Evaluation  Patient identified by MRN, date of birth, ID band Patient awake    Reviewed: Allergy & Precautions, NPO status , Patient's Chart, lab work & pertinent test results  Airway Mallampati: I       Dental  (+) Teeth Intact   Pulmonary COPD, Current Smoker,     + decreased breath sounds      Cardiovascular Exercise Tolerance: Good hypertension, Pt. on medications  Rhythm:Regular Rate:Normal     Neuro/Psych negative psych ROS   GI/Hepatic negative GI ROS, Neg liver ROS,   Endo/Other  Hypothyroidism   Renal/GU negative Renal ROS     Musculoskeletal  (+) Arthritis , Osteoarthritis,    Abdominal Normal abdominal exam  (+)   Peds negative pediatric ROS (+)  Hematology negative hematology ROS (+)   Anesthesia Other Findings   Reproductive/Obstetrics                             Anesthesia Physical  Anesthesia Plan  ASA: II  Anesthesia Plan: General   Post-op Pain Management:    Induction: Intravenous  PONV Risk Score and Plan: 0  Airway Management Planned: Natural Airway and Nasal Cannula  Additional Equipment:   Intra-op Plan:   Post-operative Plan:   Informed Consent: I have reviewed the patients History and Physical, chart, labs and discussed the procedure including the risks, benefits and alternatives for the proposed anesthesia with the patient or authorized representative who has indicated his/her understanding and acceptance.       Plan Discussed with: CRNA  Anesthesia Plan Comments:         Anesthesia Quick Evaluation

## 2019-01-01 NOTE — Anesthesia Post-op Follow-up Note (Signed)
Anesthesia QCDR form completed.        

## 2019-01-01 NOTE — Anesthesia Procedure Notes (Signed)
Date/Time: 01/01/2019 11:20 AM Performed by: Allean Found, CRNA Pre-anesthesia Checklist: Patient identified, Emergency Drugs available, Suction available, Patient being monitored and Timeout performed Patient Re-evaluated:Patient Re-evaluated prior to induction Oxygen Delivery Method: Nasal cannula Placement Confirmation: positive ETCO2

## 2019-01-02 ENCOUNTER — Encounter: Payer: Self-pay | Admitting: Gastroenterology

## 2019-01-02 LAB — SURGICAL PATHOLOGY

## 2019-01-02 NOTE — Anesthesia Postprocedure Evaluation (Signed)
Anesthesia Post Note  Patient: Curtis Carroll  Procedure(s) Performed: COLONOSCOPY WITH PROPOFOL (N/A )  Patient location during evaluation: Endoscopy Anesthesia Type: General Level of consciousness: awake and alert Pain management: pain level controlled Vital Signs Assessment: post-procedure vital signs reviewed and stable Respiratory status: spontaneous breathing, nonlabored ventilation, respiratory function stable and patient connected to nasal cannula oxygen Cardiovascular status: blood pressure returned to baseline and stable Postop Assessment: no apparent nausea or vomiting Anesthetic complications: no     Last Vitals:  Vitals:   01/01/19 0926 01/01/19 1236  BP: 121/81 99/63  Pulse: 84   Resp: 20 15  Temp: 36.8 C   SpO2: 100%     Last Pain:  Vitals:   01/02/19 0733  TempSrc:   PainSc: 0-No pain                 Martha Clan

## 2019-08-17 ENCOUNTER — Inpatient Hospital Stay: Payer: Medicare Other | Attending: Genetic Counselor | Admitting: Genetic Counselor

## 2019-08-17 ENCOUNTER — Other Ambulatory Visit: Payer: Self-pay

## 2019-08-17 DIAGNOSIS — Z1379 Encounter for other screening for genetic and chromosomal anomalies: Secondary | ICD-10-CM

## 2019-08-17 DIAGNOSIS — Z801 Family history of malignant neoplasm of trachea, bronchus and lung: Secondary | ICD-10-CM

## 2019-08-17 DIAGNOSIS — Z86010 Personal history of colonic polyps: Secondary | ICD-10-CM

## 2019-08-17 DIAGNOSIS — Z808 Family history of malignant neoplasm of other organs or systems: Secondary | ICD-10-CM

## 2019-08-17 DIAGNOSIS — Z809 Family history of malignant neoplasm, unspecified: Secondary | ICD-10-CM

## 2019-08-20 ENCOUNTER — Encounter: Payer: Self-pay | Admitting: Genetic Counselor

## 2019-08-20 DIAGNOSIS — Z801 Family history of malignant neoplasm of trachea, bronchus and lung: Secondary | ICD-10-CM | POA: Insufficient documentation

## 2019-08-20 DIAGNOSIS — Z809 Family history of malignant neoplasm, unspecified: Secondary | ICD-10-CM | POA: Insufficient documentation

## 2019-08-20 DIAGNOSIS — Z86010 Personal history of colonic polyps: Secondary | ICD-10-CM | POA: Insufficient documentation

## 2019-08-20 DIAGNOSIS — Z808 Family history of malignant neoplasm of other organs or systems: Secondary | ICD-10-CM | POA: Insufficient documentation

## 2019-08-20 NOTE — Progress Notes (Signed)
REFERRING PROVIDER: Lollie Sails, MD [facility] [place],  [place] [address]  PRIMARY PROVIDER:  Lenard Simmer, MD  PRIMARY REASON FOR VISIT:  1. History of colon polyps   2. Family history of brain cancer   3. Family history of lung cancer   4. Family history of cancer      I connected with Curtis Carroll on 08/17/2019 at 9:00 am EDT by Mychart video conference and verified that I am speaking with the correct person using two identifiers.   Patient location: home Provider location: [facility] office  HISTORY OF PRESENT ILLNESS:   Curtis Carroll, a 74 y.o. male, was seen for a [place] cancer genetics consultation at the request of Dr. Gustavo Lah due to a personal history of colon polyps.  Curtis Carroll presents to clinic today to discuss the possibility of a hereditary predisposition to cancer, genetic testing, and to further clarify his future cancer risks, as well as potential cancer risks for family members.   Curtis Carroll does not have a personal history of cancer. He does have a history of both adenomatous and hyperplastic colon polyps. Upon a review of his records, he has had at least 60 polyps since 2011 - more than 10 of which were adenomatous polyps. His most recent colonoscopy was performed on 01/01/2019.    CANCER HISTORY:  Oncology History   No history exists.     Past Medical History:  Diagnosis Date  . Arthritis   . 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 Family history of brain cancer   . Family history of cancer   . Family history of lung cancer   . History of colon polyps   . Hyperlipidemia   . Hypertension   . Polio     Past Surgical History:  Procedure Laterality Date  . ANKLE ARTHROCENTESIS Right   . COLONOSCOPY WITH PROPOFOL N/A 03/25/2017   Procedure: COLONOSCOPY WITH PROPOFOL;  Surgeon: Lollie Sails, MD;  Location: [facility] ENDOSCOPY;  Service: Endoscopy;  Laterality: N/A;  . COLONOSCOPY WITH PROPOFOL  N/A 01/01/2019   Procedure: COLONOSCOPY WITH PROPOFOL;  Surgeon: Lollie Sails, MD;  Location: [facility] ENDOSCOPY;  Service: Endoscopy;  Laterality: N/A;  . KNEE ARTHROSCOPY Left   . pins in toes Right   . SHOULDER ARTHROSCOPY Right   . 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 Marital status: Married    Spouse name: Not on file  . Number of children: Not on file  . Years of education: Not on file  . Highest education level: Not on file  Occupational History  . Not on file  Tobacco Use  . Smoking status: Current Some Day Smoker    Packs/day: 0.25    Types: Cigarettes  . Smokeless tobacco: Never Used  Substance and Sexual Activity  . Alcohol use: No  . Drug use: No  . Sexual activity: Not on file  Other Topics Concern  . Not on file  Social History Narrative  . Not on file   Social Determinants of Health   Financial Resource Strain:   . Difficulty of Paying Living Expenses:   Food Insecurity:   . Worried About Charity fundraiser in the Last Year:   . [place] in the Last Year:  Transportation Needs:   . Film/video editor (Medical):   Marland Kitchen Lack of Transportation (Non-Medical):   Physical Activity:   . Days of Exercise per Week:   . Minutes of Exercise per Session:   Stress:   . Feeling of Stress :   Social Connections:   . Frequency of Communication with Friends and Family:   . Frequency of Social Gatherings with Friends and Family:   . Attends Religious Services:   . Active Member of Clubs or Organizations:   . Attends Archivist Meetings:   Marland Kitchen Marital Status:      FAMILY HISTORY:  We obtained a detailed, 4-generation family history.  Significant diagnoses are listed below: Family History  Problem Relation Age of Onset  . Brain cancer Maternal Uncle        dx. in his  early 20s  . Cancer Maternal Grandfather 45       unknown type  . Cancer Other 60       unknown type, maternal cousin's daughter  . Cancer Brother 60       cancer of the jaw  . Cirrhosis Brother        liver, alcoholism  . Lung cancer Brother 50       metastatic to brain, smoker  . Cancer Other 60       unknown type, maternal cousin's daughter   Curtis Carroll has one daughter who is 6 and has not had cancer, although he notes that she had half of her thyroid removed. He has three brothers. One brother is currently living at the age of 61, and Curtis Carroll thinks that this brother has not had colon polyps. His two other brothers are deceased. One died at age 32, was diagnosed with cancer of the jaw when he was 86, and also had cirrhosis of the liver due to alcoholism. The other brother died at the age of 19 from lung cancer that was metastatic to his brain, initially diagnosed at age 65, and had a history of smoking.  Curtis Carroll mother died at age 66 and had a history of one or two colon polyps. He had two maternal uncles and two maternal aunts. One of his uncles died in his early 30s from brain cancer. His other aunts and uncle died when they were older than 58 and did not have cancer. While he does not know of any maternal first cousins with cancer, he notes that one of his cousins had two daughters who both died from cancer in their 56s, although he is not sure what type of cancer they had. Curtis Carroll maternal grandmother died in her late 22s and his grandfather died at age 51 from cancer, although he is not sure what type of cancer.  Curtis Carroll father died at age 18 and did not have cancer. Curtis Carroll believes that his father did not have a colonoscopy and therefore is unsure if he had colon polyps. He had one paternal uncle, although he does not have any information about him. His paternal grandparents died in their 45s or 73s and did not have cancer.  Curtis Carroll is  unaware of previous family history of genetic testing for hereditary cancer risks. Patient's maternal ancestors are of Native [place] and [place] descent, and paternal ancestors are of [place] descent. There is no reported Ashkenazi Jewish ancestry. There is no known consanguinity.  GENETIC COUNSELING ASSESSMENT: Curtis Carroll is a 74 y.o. male with a personal history of at least 32  colon polyps, which is somewhat suggestive of a hereditary polyposis syndrome and predisposition to cancer. We, therefore, discussed and recommended the following at today's visit.   DISCUSSION: We discussed that polyps in general are common, however, most people have fewer than 5 lifetime polyps.  When an individual has 10 or more polyps we become concerned about an underlying polyposis syndrome.  The most common hereditary polyposis syndromes are Familial Adenomatous Polyposis (FAP), caused by mutations in the APC gene, and MUTYH-Associated Polyposis (MAP), caused by mutations in the MUTYH gene.  There are other genes that are associated with polyposis, such as NTHL1 and MSH3.  We discussed that testing is beneficial for several reasons, including knowing about cancer risks, identifying potential screening and risk-reduction options that may be appropriate, and to understand if other family members could be at risk for colon polyps and/or cancer and allow them to undergo genetic testing.   We reviewed the characteristics, features and inheritance patterns of hereditary cancer syndromes. We also discussed genetic testing, including the appropriate family members to test, the process of testing, insurance coverage and turn-around-time for results. We discussed the implications of a negative, positive and/or variant of uncertain significant result. Based on Curtis Carroll personal history of colon polyps, he meets medical criteria for genetic testing. We therefore recommended that Curtis Carroll pursue genetic testing for a gene  panel that includes known polyposis genes, if he is interested.   Curtis Carroll is not sure if he would like to proceed with genetic testing at this time. We discussed that, although genetic testing may provide more specific guidelines regarding how to screen for polyps/cancer, testing is optional and guidelines exist for following individuals who have a history of many colon polyps and their family members when genetic testing is not completed or when no gene mutation is identified.  Per the NCCN Guidelines (Genetic/Familial High-Risk Assessment: Colorectal; Version 1.2020), individuals who have serrated polyposis (including individuals who have had more than 20 serrated lesions/polyps of any size distributed throughout the large bowel, with at least 5 being proximal to the rectum) are recommended to have a colonoscopy with polypectomy every 1 to 3 years depending on the number and size of polyps. A surgical referral may be considered if the polyp burden cannot be managed through colonoscopy and polypectomy. First-degree relatives of individuals with serrated polyposis should consider having a colonoscopy at age 31, the same age as the youngest diagnosis of serrated polyposis if uncomplicated by cancer, or ten years earlier than the youngest diagnosis of colorectal cancer in the family secondary to serrated polyposis (whichever is earliest). Family members should then repeat colonoscopy every 5 years if no polyps are found, or every 1-3 years if proximal serrated polyps or multiple adenomas are found.   PLAN:  Curtis Carroll did not wish to pursue genetic testing at today's visit. We understand this decision and remain available to coordinate genetic testing at any time in the future. We, therefore, recommend Curtis Carroll continue to follow the cancer screening guidelines given by his primary healthcare provider.  Lastly, we encouraged Curtis Carroll to remain in contact with cancer genetics annually so that  we can continuously update the family history and inform him of any changes in cancer genetics and testing that may be of benefit for this family.   Curtis Carroll questions were answered to his satisfaction today. Our contact information was provided should additional questions or concerns arise. Thank you for the referral and allowing [place] to share in the  care of your patient.   Clint Guy, MS, [facility] Genetic Counselor [email or web address] Phone: [phone]  The patient was seen for a total of 30 minutes in face-to-face genetic counseling.  This patient was discussed with Drs. Magrinat, Lindi Adie and/or Burr Medico who agrees with the above.    _______________________________________________________________________ For Office Staff:  Number of people involved in session: 1 Was an Intern/ student involved with case: no

## 2019-10-21 ENCOUNTER — Other Ambulatory Visit: Payer: Self-pay | Admitting: Endocrinology

## 2019-10-21 DIAGNOSIS — R634 Abnormal weight loss: Secondary | ICD-10-CM

## 2019-11-02 ENCOUNTER — Other Ambulatory Visit: Payer: Self-pay

## 2019-11-02 ENCOUNTER — Ambulatory Visit
Admission: RE | Admit: 2019-11-02 | Discharge: 2019-11-02 | Disposition: A | Discharge status: Home / Self Care | Payer: Medicare Other | Source: Ambulatory Visit | Attending: Endocrinology | Admitting: Endocrinology

## 2019-11-02 DIAGNOSIS — R634 Abnormal weight loss: Secondary | ICD-10-CM | POA: Insufficient documentation

## 2019-11-02 LAB — POCT I-STAT CREATININE: Creatinine, Ser: 0.8 mg/dL (ref 0.61–1.24)

## 2019-11-02 MED ORDER — IOHEXOL 300 MG/ML  SOLN
100.0000 mL | Freq: Once | INTRAMUSCULAR | Status: AC | PRN
  Administered 2019-11-02: 100 mL via INTRAVENOUS

## 2019-11-04 ENCOUNTER — Other Ambulatory Visit
Admission: RE | Admit: 2019-11-04 | Discharge: 2019-11-04 | Disposition: A | Discharge status: Home / Self Care | Payer: Medicare Other | Source: Ambulatory Visit | Attending: General Surgery | Admitting: General Surgery

## 2019-11-04 ENCOUNTER — Other Ambulatory Visit: Payer: Self-pay

## 2019-11-04 DIAGNOSIS — Z20822 Contact with and (suspected) exposure to covid-19: Secondary | ICD-10-CM | POA: Diagnosis not present

## 2019-11-04 DIAGNOSIS — Z01812 Encounter for preprocedural laboratory examination: Secondary | ICD-10-CM | POA: Diagnosis present

## 2019-11-04 LAB — SARS CORONAVIRUS 2 (TAT 6-24 HRS): SARS Coronavirus 2: NEGATIVE

## 2019-11-05 ENCOUNTER — Encounter: Payer: Self-pay | Admitting: General Surgery

## 2019-11-06 ENCOUNTER — Ambulatory Visit: Payer: Medicare Other | Admitting: Anesthesiology

## 2019-11-06 ENCOUNTER — Encounter: Payer: Self-pay | Admitting: General Surgery

## 2019-11-06 ENCOUNTER — Encounter
Admission: RE | Disposition: A | Discharge status: Home / Self Care | Payer: Self-pay | Source: Home / Self Care | Attending: General Surgery

## 2019-11-06 ENCOUNTER — Other Ambulatory Visit: Payer: Self-pay

## 2019-11-06 ENCOUNTER — Ambulatory Visit
Admission: RE | Admit: 2019-11-06 | Discharge: 2019-11-06 | Disposition: A | Discharge status: Home / Self Care | Payer: Medicare Other | Attending: General Surgery | Admitting: General Surgery

## 2019-11-06 DIAGNOSIS — Z808 Family history of malignant neoplasm of other organs or systems: Secondary | ICD-10-CM | POA: Diagnosis not present

## 2019-11-06 DIAGNOSIS — Z79899 Other long term (current) drug therapy: Secondary | ICD-10-CM | POA: Diagnosis not present

## 2019-11-06 DIAGNOSIS — K2950 Unspecified chronic gastritis without bleeding: Secondary | ICD-10-CM | POA: Diagnosis not present

## 2019-11-06 DIAGNOSIS — K900 Celiac disease: Secondary | ICD-10-CM | POA: Diagnosis not present

## 2019-11-06 DIAGNOSIS — Z880 Allergy status to penicillin: Secondary | ICD-10-CM | POA: Diagnosis not present

## 2019-11-06 DIAGNOSIS — Z86010 Personal history of colonic polyps: Secondary | ICD-10-CM | POA: Insufficient documentation

## 2019-11-06 DIAGNOSIS — Z885 Allergy status to narcotic agent status: Secondary | ICD-10-CM | POA: Insufficient documentation

## 2019-11-06 DIAGNOSIS — M25579 Pain in unspecified ankle and joints of unspecified foot: Secondary | ICD-10-CM | POA: Insufficient documentation

## 2019-11-06 DIAGNOSIS — Z8612 Personal history of poliomyelitis: Secondary | ICD-10-CM | POA: Diagnosis not present

## 2019-11-06 DIAGNOSIS — M1990 Unspecified osteoarthritis, unspecified site: Secondary | ICD-10-CM | POA: Diagnosis not present

## 2019-11-06 DIAGNOSIS — Z801 Family history of malignant neoplasm of trachea, bronchus and lung: Secondary | ICD-10-CM | POA: Insufficient documentation

## 2019-11-06 DIAGNOSIS — Z7982 Long term (current) use of aspirin: Secondary | ICD-10-CM | POA: Insufficient documentation

## 2019-11-06 DIAGNOSIS — B9681 Helicobacter pylori [H. pylori] as the cause of diseases classified elsewhere: Secondary | ICD-10-CM | POA: Diagnosis not present

## 2019-11-06 DIAGNOSIS — E785 Hyperlipidemia, unspecified: Secondary | ICD-10-CM | POA: Diagnosis not present

## 2019-11-06 DIAGNOSIS — Z87891 Personal history of nicotine dependence: Secondary | ICD-10-CM | POA: Diagnosis not present

## 2019-11-06 DIAGNOSIS — Z882 Allergy status to sulfonamides status: Secondary | ICD-10-CM | POA: Diagnosis not present

## 2019-11-06 DIAGNOSIS — I10 Essential (primary) hypertension: Secondary | ICD-10-CM | POA: Diagnosis not present

## 2019-11-06 DIAGNOSIS — Z96652 Presence of left artificial knee joint: Secondary | ICD-10-CM | POA: Insufficient documentation

## 2019-11-06 DIAGNOSIS — R6881 Early satiety: Secondary | ICD-10-CM | POA: Diagnosis present

## 2019-11-06 DIAGNOSIS — G8929 Other chronic pain: Secondary | ICD-10-CM | POA: Diagnosis not present

## 2019-11-06 DIAGNOSIS — E739 Lactose intolerance, unspecified: Secondary | ICD-10-CM | POA: Diagnosis not present

## 2019-11-06 DIAGNOSIS — Z91018 Allergy to other foods: Secondary | ICD-10-CM | POA: Diagnosis not present

## 2019-11-06 HISTORY — DX: Polyp of colon: K63.5

## 2019-11-06 HISTORY — DX: Congenital talipes calcaneovarus, unspecified foot: Q66.10

## 2019-11-06 HISTORY — DX: Other chronic pain: G89.29

## 2019-11-06 HISTORY — DX: Other enthesopathy of unspecified foot and ankle: M77.50

## 2019-11-06 HISTORY — PX: ESOPHAGOGASTRODUODENOSCOPY (EGD) WITH PROPOFOL: SHX5813

## 2019-11-06 SURGERY — ESOPHAGOGASTRODUODENOSCOPY (EGD) WITH PROPOFOL
Anesthesia: General

## 2019-11-06 MED ORDER — PROPOFOL 500 MG/50ML IV EMUL
INTRAVENOUS | Status: AC
  Filled 2019-11-06: qty 50

## 2019-11-06 MED ORDER — SODIUM CHLORIDE 0.9 % IV SOLN
INTRAVENOUS | Status: DC
  Administered 2019-11-06: 1000 mL via INTRAVENOUS

## 2019-11-06 MED ORDER — PROPOFOL 500 MG/50ML IV EMUL
INTRAVENOUS | Status: DC | PRN
  Administered 2019-11-06: 150 ug/kg/min via INTRAVENOUS

## 2019-11-06 MED ORDER — LIDOCAINE HCL (CARDIAC) PF 100 MG/5ML IV SOSY
PREFILLED_SYRINGE | INTRAVENOUS | Status: DC | PRN
  Administered 2019-11-06: 40 mg via INTRAVENOUS

## 2019-11-06 MED ORDER — PROPOFOL 10 MG/ML IV BOLUS
INTRAVENOUS | Status: DC | PRN
  Administered 2019-11-06: 90 mg via INTRAVENOUS

## 2019-11-06 NOTE — Anesthesia Postprocedure Evaluation (Signed)
Anesthesia Post Note  Patient: Curtis Carroll  Procedure(s) Performed: ESOPHAGOGASTRODUODENOSCOPY (EGD) WITH PROPOFOL (N/A )  Patient location during evaluation: PACU Anesthesia Type: General Level of consciousness: awake and alert Pain management: pain level controlled Vital Signs Assessment: post-procedure vital signs reviewed and stable Respiratory status: spontaneous breathing, nonlabored ventilation, respiratory function stable and patient connected to nasal cannula oxygen Cardiovascular status: blood pressure returned to baseline and stable Postop Assessment: no apparent nausea or vomiting Anesthetic complications: no   No complications documented.   Last Vitals:  Vitals:   11/06/19 0954 11/06/19 1000  BP: 106/69 107/74  Pulse: (!) 46 (!) 48  Resp: 14 15  Temp:    SpO2: 99% 97%    Last Pain:  Vitals:   11/06/19 0834  TempSrc: Temporal  PainSc: 0-No pain                 Molli Barrows

## 2019-11-06 NOTE — Op Note (Signed)
[facility]astro[facility]ology Patient Name: Curtis Carroll Procedure Date: 11/06/2019 9:03 AM MRN: [id number] Account #: [id number] Date of Birth: [birth year] Admit Type: Outpatient Age: 74 Room: [facility] ENDO ROOM 1 Gender: Male Note Status: Finalized Procedure:             Upper GI endoscopy Indications:           Early satiety Providers:             Robert Bellow, MD Referring MD:          Lenard Simmer, MD (Referring MD) Medicines:             Monitored Anesthesia Care Complications:         No immediate complications. Procedure:             Pre-Anesthesia Assessment:                        - Prior to the procedure, a History and Physical was                         performed, and patient medications, allergies and                         sensitivities were reviewed. The patient's tolerance                         of previous anesthesia was reviewed.                        - The risks and benefits of the procedure and the                         sedation options and risks were discussed with the                         patient. All questions were answered and informed                         consent was obtained.                        After obtaining informed consent, the endoscope was                         passed under direct vision. Throughout the procedure,                         the patient's blood pressure, pulse, and oxygen                         saturations were monitored continuously. The Endoscope                         was introduced through the mouth, and advanced to the                         second part of duodenum. The upper GI endoscopy was  accomplished without difficulty. The patient tolerated                         the procedure well. Findings:      The esophagus was normal.      The examined duodenum was normal.      Diffuse moderate inflammation characterized by erythema was found in the       gastric  antrum. Biopsies were taken with a cold forceps for histology. Impression:            - Normal esophagus.                        - Normal examined duodenum.                        - Chronic gastritis. Biopsied. Recommendation:        - Telephone endoscopist for pathology results in 1                         week. Procedure Code(s):     --- Professional ---                        [phone], Esophagogastroduodenoscopy, flexible,                         transoral; with biopsy, single or multiple Diagnosis Code(s):     --- Professional ---                        K29.50, Unspecified chronic gastritis without bleeding                        R68.81, Early satiety CPT copyright 2019 American Medical Association. All rights reserved. The codes documented in this report are preliminary and upon coder review may  be revised to meet current compliance requirements. Robert Bellow, MD 11/06/2019 9:32:07 AM This report has been signed electronically. Number of Addenda: 0 Note Initiated On: 11/06/2019 9:03 AM Estimated Blood Loss:  Estimated blood loss: none.      [facility]

## 2019-11-06 NOTE — H&P (Signed)
Curtis Carroll [id number] 24-Nov-1945     HPI:  74 y/o male with a history of marked weight loss beginning in fall 2020.  60 pound weight loss.  One month history of profound early satiety. No dysphagia.  Since starting PPI and gaviscon, marked improvement in dietary tolerance.   Medications Prior to Admission  Medication Sig Dispense Refill Last Dose  . acetaminophen (TYLENOL) 500 MG tablet Take 500 mg by mouth every 6 (six) hours as needed.   Past Week at Unknown time  . aspirin EC 81 MG tablet Take 81 mg by mouth daily.   Past Month at Unknown time  . atorvastatin (LIPITOR) 10 MG tablet Take 5 mg by mouth daily at 6 PM.   11/05/2019 at Unknown time  . Eszopiclone (ESZOPICLONE) 3 MG TABS Take 3 mg by mouth at bedtime. Take immediately before bedtime   Past Month at Unknown time  . levothyroxine (SYNTHROID, LEVOTHROID) 100 MCG tablet Take 100 mcg by mouth daily before breakfast.   11/05/2019 at Unknown time  . lisinopril (PRINIVIL,ZESTRIL) 10 MG tablet Take 10 mg by mouth at bedtime.   11/05/2019 at Unknown time  . multivitamin-iron-minerals-folic acid (CENTRUM) chewable tablet Chew 1 tablet by mouth daily.   Past Week at Unknown time  . pantoprazole (PROTONIX) 40 MG tablet Take 40 mg by mouth daily.   11/05/2019 at Unknown time  . ondansetron (ZOFRAN) 4 MG tablet Take 1 tablet (4 mg total) by mouth every 6 (six) hours as needed for nausea. (Patient not taking: Reported on 11/06/2019) 20 tablet 0 Not Taking at Unknown time   Allergies  Allergen Reactions  . Alcohol-Sulfur [Sulfur]   . Black Pepper [[place]] Diarrhea  . Codeine Nausea Only  . Lactose Intolerance (Gi) Other (See Comments)    Bloating   . Penicillins Nausea Only  . Wheat Bran Other (See Comments)    Bloating    Past Medical History:  Diagnosis Date  . Arthritis   . Cavovarus deformity of foot   . 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 Chronic ankle pain   . Colon polyp   . Enthesopathy of ankle/tarsus   .  Family history of brain cancer   . Family history of cancer   . Family history of lung cancer   . History of colon polyps   . Hyperlipidemia   . Hyperlipidemia   . Hypertension   . Polio    Past Surgical History:  Procedure Laterality Date  . ANKLE ARTHROCENTESIS Right   . COLONOSCOPY WITH PROPOFOL N/A 03/25/2017   Procedure: COLONOSCOPY WITH PROPOFOL;  Surgeon: Lollie Sails, MD;  Location: [facility] ENDOSCOPY;  Service: Endoscopy;  Laterality: N/A;  . COLONOSCOPY WITH PROPOFOL N/A 01/01/2019   Procedure: COLONOSCOPY WITH PROPOFOL;  Surgeon: Lollie Sails, MD;  Location: [facility] ENDOSCOPY;  Service: Endoscopy;  Laterality: N/A;  . KNEE ARTHROSCOPY Left   . pins in toes Right   . SHOULDER ARTHROSCOPY Right   . 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 Marital status: Married    Spouse name: Not on file  . Number of children: Not on file  . Years of education: Not on file  . Highest education level: Not on file  Occupational History  . Not on file  Tobacco Use  . Smoking status:  Former Smoker    Packs/day: 0.25    Types: Cigarettes    Quit date: 11/06/2014    Years since quitting: 5.0  . Smokeless tobacco: Never Used  Vaping Use  . Vaping Use: Never used  Substance and Sexual Activity  . Alcohol use: No  . Drug use: No  . Sexual activity: Yes  Other Topics Concern  . Not on file  Social History Narrative  . Not on file   Social Determinants of Health   Financial Resource Strain:   . Difficulty of Paying Living Expenses:   Food Insecurity:   . Worried About Charity fundraiser in the Last Year:   . Arboriculturist in the Last Year:   Transportation Needs:   . Film/video editor (Medical):   Marland Kitchen Lack of Transportation (Non-Medical):   Physical Activity:   . Days of Exercise per Week:   .  Minutes of Exercise per Session:   Stress:   . Feeling of Stress :   Social Connections:   . Frequency of Communication with Friends and Family:   . Frequency of Social Gatherings with Friends and Family:   . Attends Religious Services:   . Active Member of Clubs or Organizations:   . Attends Archivist Meetings:   Marland Kitchen Marital Status:   Intimate Partner Violence:   . Fear of Current or Ex-Partner:   . Emotionally Abused:   Marland Kitchen Physically Abused:   . Sexually Abused:    Social History   Social History Narrative  . Not on file     ROS: Negative.     PE: HEENT: Negative. Lungs: Clear. Cardio: RR.  Assessment/Plan:  Proceed with planned upper endoscopy.  Forest Gleason [facility] 11/06/2019

## 2019-11-06 NOTE — Anesthesia Procedure Notes (Signed)
Date/Time: 11/06/2019 9:10 AM Performed by: Doreen Salvage, CRNA Pre-anesthesia Checklist: Patient identified, Emergency Drugs available, Suction available and Patient being monitored Patient Re-evaluated:Patient Re-evaluated prior to induction Oxygen Delivery Method: Nasal cannula Induction Type: IV induction Dental Injury: Teeth and Oropharynx as per pre-operative assessment  Comments: Nasal cannula with etCO2 monitoring

## 2019-11-06 NOTE — Transfer of Care (Signed)
Immediate Anesthesia Transfer of Care Note  Patient: Curtis Carroll  Procedure(s) Performed: Procedure(s): ESOPHAGOGASTRODUODENOSCOPY (EGD) WITH PROPOFOL (N/A)  Patient Location: PACU and Endoscopy Unit  Anesthesia Type:General  Level of Consciousness: sedated  Airway & Oxygen Therapy: Patient Spontanous Breathing and Patient connected to nasal cannula oxygen  Post-op Assessment: Report given to RN and Post -op Vital signs reviewed and stable  Post vital signs: Reviewed and stable  Last Vitals:  Vitals:   11/06/19 0834 11/06/19 0931  BP: 115/72 96/63  Pulse: 71   Resp: 18 13  Temp: (!) 36 C   SpO2: 016% 58%    Complications: No apparent anesthesia complications

## 2019-11-06 NOTE — Anesthesia Preprocedure Evaluation (Signed)
Anesthesia Evaluation  Patient identified by MRN, date of birth, ID band Patient awake    Reviewed: Allergy & Precautions, H&P , NPO status , Patient's Chart, lab work & pertinent test results, reviewed documented beta blocker date and time   Airway Mallampati: II   Neck ROM: full    Dental  (+) Poor Dentition   Pulmonary neg pulmonary ROS, Current Smoker,    Pulmonary exam normal        Cardiovascular Exercise Tolerance: Good hypertension, On Medications negative cardio ROS Normal cardiovascular exam Rhythm:regular Rate:Normal     Neuro/Psych negative neurological ROS  negative psych ROS   GI/Hepatic negative GI ROS, Neg liver ROS,   Endo/Other  negative endocrine ROS  Renal/GU negative Renal ROS  negative genitourinary   Musculoskeletal   Abdominal   Peds  Hematology negative hematology ROS (+)   Anesthesia Other Findings Past Medical History: No date: Arthritis 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 No date: Celiac disease     Comment:  allergy to wheat grain No date: Chronic ankle pain No date: Colon polyp No date: Enthesopathy of ankle/tarsus No date: Family history of brain cancer No date: Family history of cancer No date: Family history of lung cancer No date: History of colon polyps No date: Hyperlipidemia No date: Hyperlipidemia No date: Hypertension No date: Polio Past Surgical History: No date: ANKLE ARTHROCENTESIS; Right 03/25/2017: COLONOSCOPY WITH PROPOFOL; N/A     Comment:  Procedure: COLONOSCOPY WITH PROPOFOL;  Surgeon:               Lollie Sails, MD;  Location: [facility] ENDOSCOPY;                Service: Endoscopy;  Laterality: N/A; 01/01/2019: COLONOSCOPY WITH PROPOFOL; N/A     Comment:  Procedure: COLONOSCOPY WITH PROPOFOL;  Surgeon:               Lollie Sails, MD;  Location: [facility] ENDOSCOPY;                Service: Endoscopy;  Laterality: N/A; No date: KNEE ARTHROSCOPY;  Left No date: pins in toes; Right No date: SHOULDER ARTHROSCOPY; Right 09/15/2015: TOTAL KNEE ARTHROPLASTY; Left     Comment:  Procedure: TOTAL KNEE ARTHROPLASTY;  Surgeon: Hessie Knows, MD;  Location: [facility] ORS;  Service: Orthopedics;                Laterality: Left;   Reproductive/Obstetrics negative OB ROS                             Anesthesia Physical Anesthesia Plan  ASA: III  Anesthesia Plan: General   Post-op Pain Management:    Induction:   PONV Risk Score and Plan:   Airway Management Planned:   Additional Equipment:   Intra-op Plan:   Post-operative Plan:   Informed Consent: I have reviewed the patients History and Physical, chart, labs and discussed the procedure including the risks, benefits and alternatives for the proposed anesthesia with the patient or authorized representative who has indicated his/her understanding and acceptance.     Dental Advisory Given  Plan Discussed with: CRNA  Anesthesia Plan Comments:         Anesthesia Quick Evaluation

## 2019-11-09 ENCOUNTER — Encounter: Payer: Self-pay | Admitting: General Surgery

## 2019-11-10 LAB — SURGICAL PATHOLOGY

## 2020-08-04 IMAGING — CT CT ABD-PELV W/ CM
2 of 5 series · 15 of 46 positions shown, 17 images · IV contrast (omnipaque)
Comparison: 01/09/2005

CLINICAL DATA: 60 pound weight loss in 1 year.

EXAM:
CT ABDOMEN AND PELVIS WITH CONTRAST
TECHNIQUE: Multidetector CT imaging of the abdomen and pelvis was performed
using the standard protocol following bolus administration of
intravenous contrast.
CONTRAST:  100mL OMNIPAQUE IOHEXOL 300 MG/ML  SOLN

[Series 2: abd pelvis 5.00 · axial · 0.72mm/px · z∈[-1536,-1106]mm · 12 of 98 slices shown, 14 images]
[im 6/98  soft-tissue]
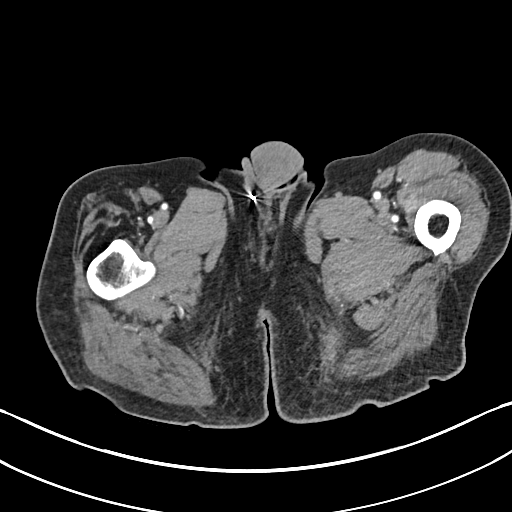
[im 6/98  bone]
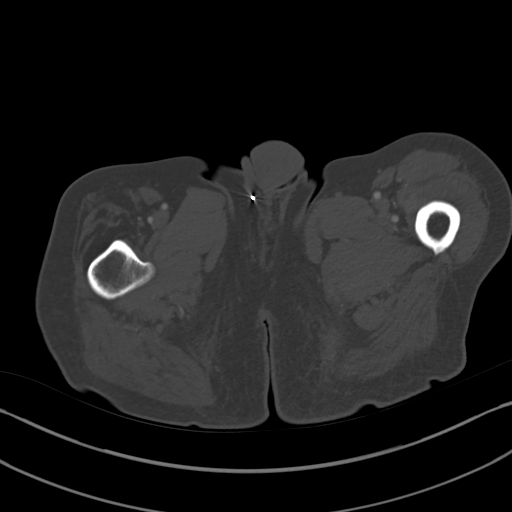
[im 16/98  soft-tissue]
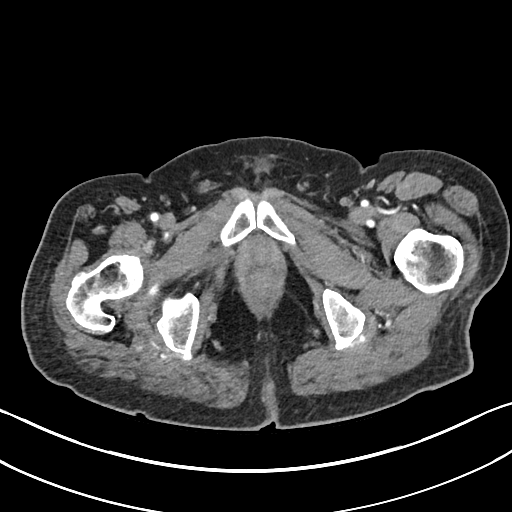
[im 21/98  soft-tissue]
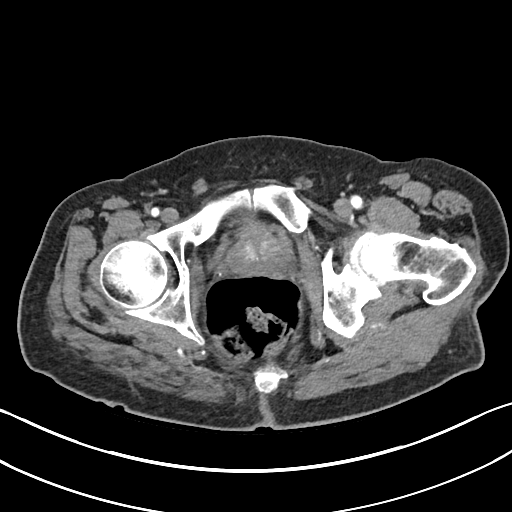
[im 31/98  soft-tissue]
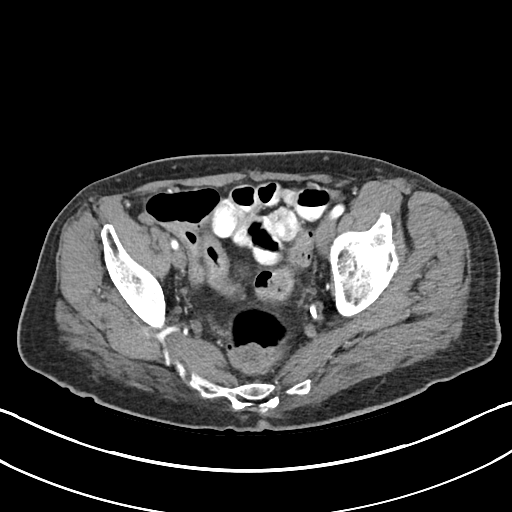
[im 36/98  soft-tissue]
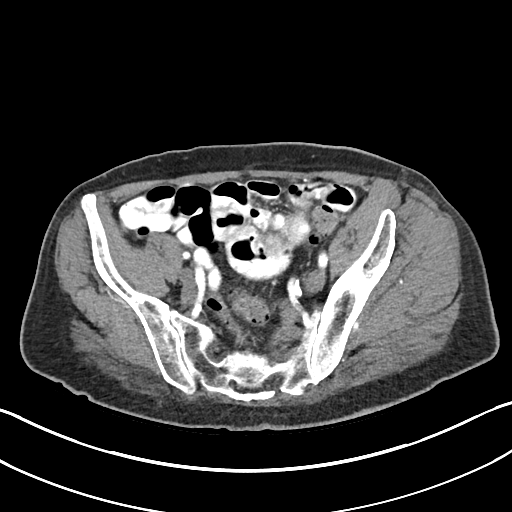
[im 46/98  soft-tissue]
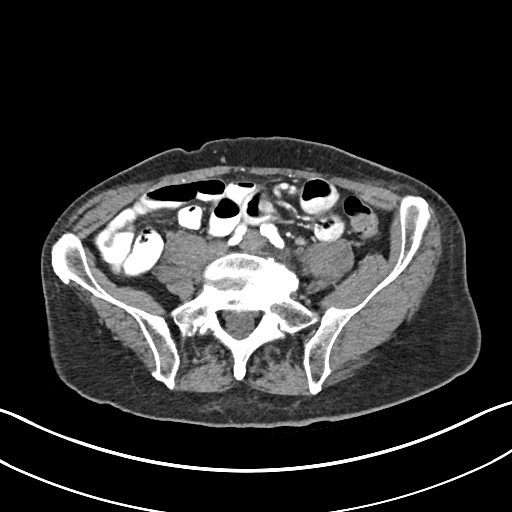
[im 52/98  soft-tissue]
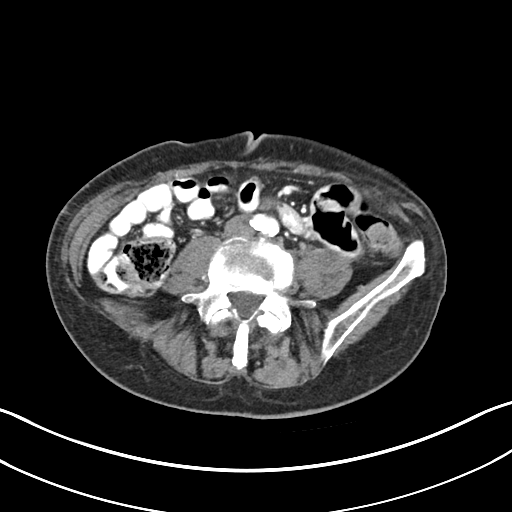
[im 62/98  soft-tissue]
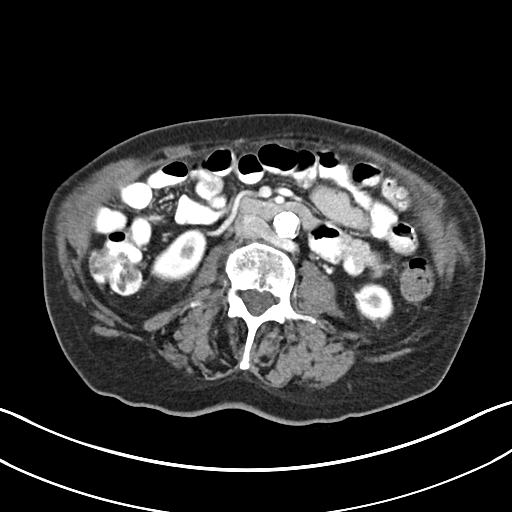
[im 67/98  soft-tissue]
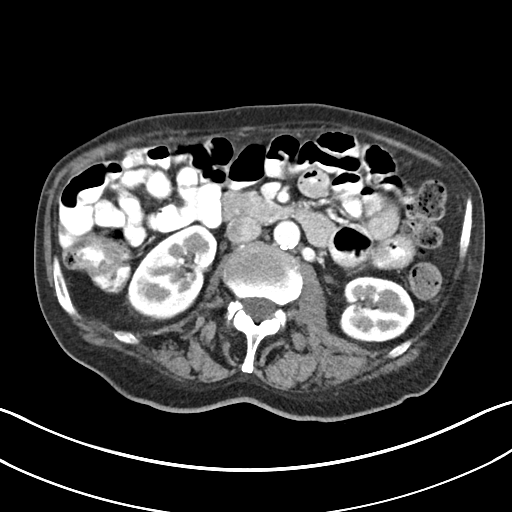
[im 67/98  bone]
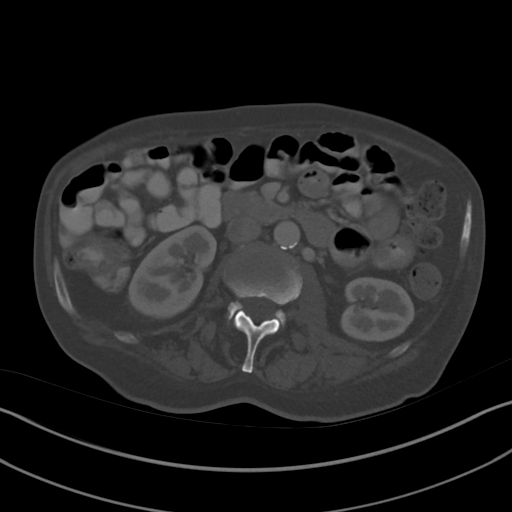
[im 77/98  soft-tissue]
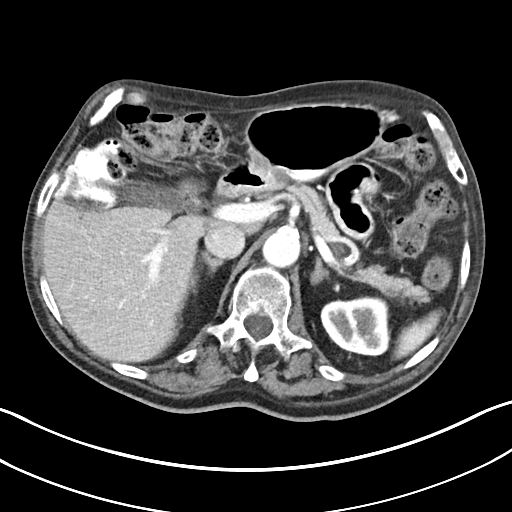
[im 82/98  soft-tissue]
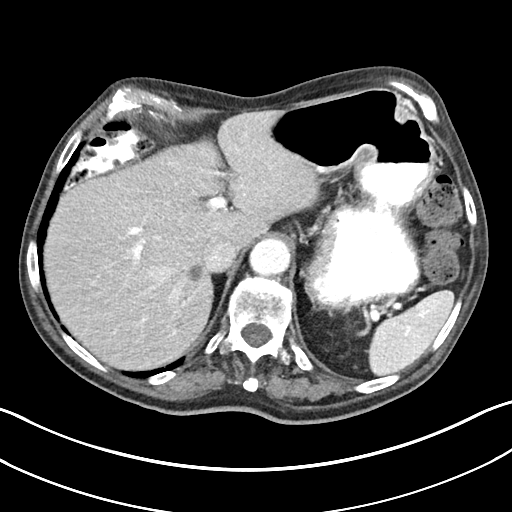
[im 92/98  soft-tissue]
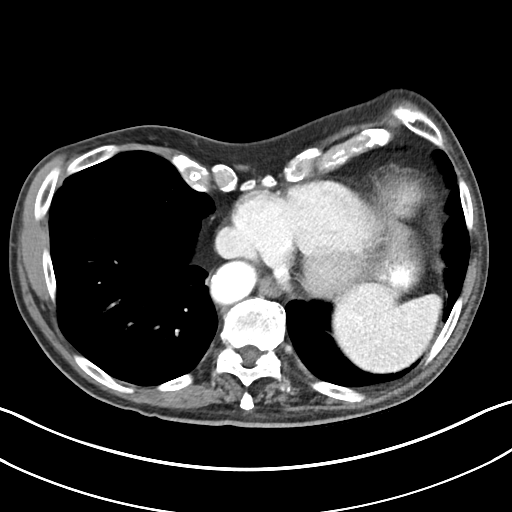

[Series 4: coronals abd pelvis 2.00 cor · coronal · 0.72mm/px · 3 of 167 slices shown]
[im 56/167  soft-tissue]
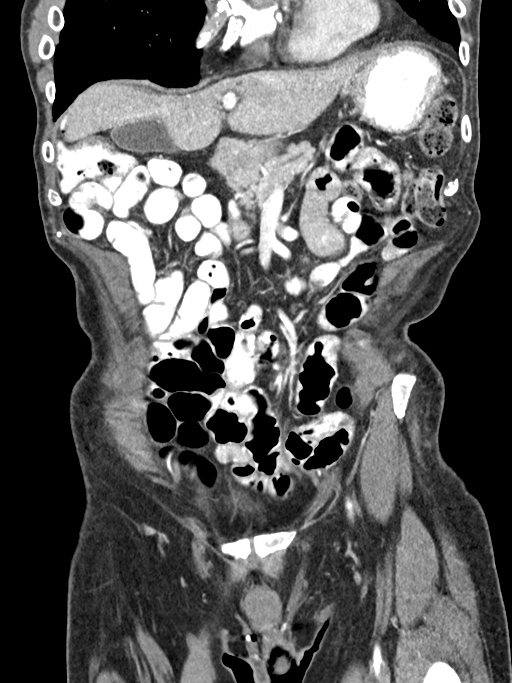
[im 74/167  soft-tissue]
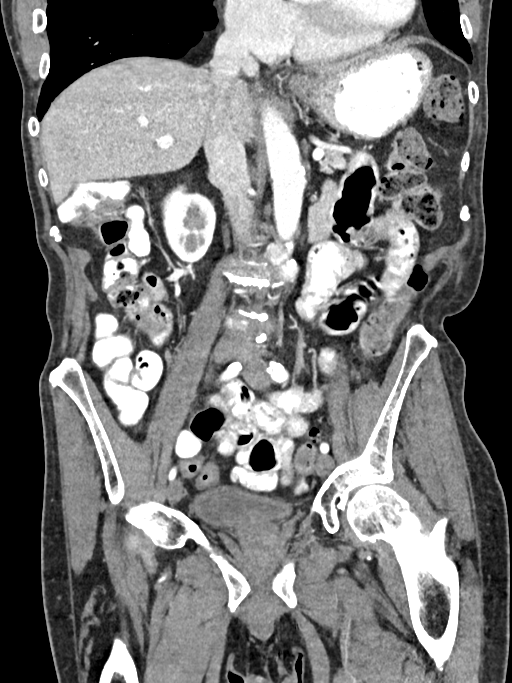
[im 93/167  soft-tissue]
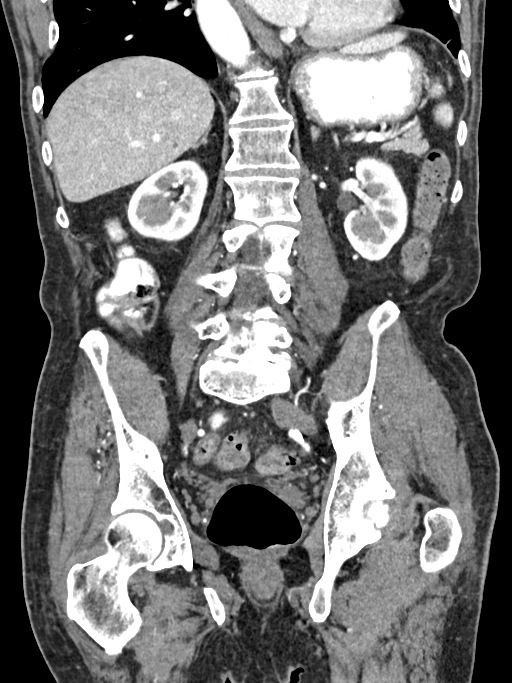

[15 of 46 positions shown; findings below may reference images not displayed]

FINDINGS: Lower chest: Severe pectus deformity with mass effect on the right
ventricle. Basilar scarring changes but no worrisome pulmonary
lesions or pleural effusion. No pericardial effusion.

Hepatobiliary: Scattered low-attenuation liver lesions consistent
with benign cysts. No worrisome hepatic lesions or intrahepatic
biliary dilatation. The gallbladder appears normal. No common bile
duct dilatation.

Pancreas: No mass, inflammation or ductal dilatation. Mild
pancreatic atrophy.

Spleen: Normal size.  No focal lesions.

Adrenals/Urinary Tract: The adrenal glands and kidneys are
unremarkable. No renal, ureteral or bladder calculi or mass.

Stomach/Bowel: Stomach, duodenum, small bowel and colon are
unremarkable. No acute inflammatory changes, mass lesions or
obstructive findings. The terminal ileum and appendix are normal.
There is moderate stool throughout the colon and down into the
rectum which may suggest constipation.

Vascular/Lymphatic: Advanced atherosclerotic calcifications
involving the aorta, iliac arteries and branch vessel ostia. No
focal aneurysm or dissection. The major venous structures are
patent. Retroaortic left renal vein is noted. No mesenteric or
retroperitoneal mass or adenopathy.

Reproductive: The prostate gland and seminal vesicles are
unremarkable.

Other: No pelvic mass or adenopathy. No free pelvic fluid
collections. No inguinal mass or adenopathy. No abdominal wall
hernia or subcutaneous lesions.

Musculoskeletal: No significant bony findings.
IMPRESSION: 1. No acute abdominal/pelvic findings, mass lesions or
lymphadenopathy.
2. Moderate stool throughout the colon and down into the rectum may
suggest constipation.
3. Advanced atherosclerotic calcifications involving the aorta and
iliac arteries.
4. Severe pectus deformity with mass effect on the right ventricle.
5. Aortic atherosclerosis.

Aortic Atherosclerosis ([id number]-[id number]).

## 2021-01-25 ENCOUNTER — Other Ambulatory Visit: Payer: Self-pay

## 2021-01-25 ENCOUNTER — Ambulatory Visit: Payer: Medicare Other | Attending: Endocrinology

## 2021-01-25 DIAGNOSIS — R2681 Unsteadiness on feet: Secondary | ICD-10-CM | POA: Diagnosis present

## 2021-01-25 DIAGNOSIS — M6281 Muscle weakness (generalized): Secondary | ICD-10-CM | POA: Diagnosis not present

## 2021-01-25 DIAGNOSIS — Z9181 History of falling: Secondary | ICD-10-CM

## 2021-01-25 NOTE — Therapy (Signed)
[facility] [address] [place], [place], [address] Phone: [phone]   Fax:  [phone]  Physical Therapy Evaluation  Patient Details  Name: Curtis Carroll MRN: [id number] Date of Birth: [birth year] Referring Provider (PT): Dr. Ronnald Collum   Encounter Date: 01/25/2021   PT End of Session - 01/25/21 1059     Visit Number 1    Number of Visits 25    Date for PT Re-Evaluation 04/19/21    Authorization Type eval: 01/25/21    PT Start Time 1100    PT Stop Time 1140    PT Time Calculation (min) 40 min    Equipment Utilized During Treatment Gait belt    Activity Tolerance Patient tolerated treatment well    Behavior During Therapy WFL for tasks assessed/performed             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   Chronic ankle pain    Colon polyp    Enthesopathy of ankle/tarsus    Family history of brain cancer    Family history of cancer    Family history of lung cancer    History of colon polyps    Hyperlipidemia    Hyperlipidemia    Hypertension    Polio     Past Surgical History:  Procedure Laterality Date   ANKLE ARTHROCENTESIS Right    COLONOSCOPY WITH PROPOFOL N/A 03/25/2017   Procedure: COLONOSCOPY WITH PROPOFOL;  Surgeon: Lollie Sails, MD;  Location: [facility] ENDOSCOPY;  Service: Endoscopy;  Laterality: N/A;   COLONOSCOPY WITH PROPOFOL N/A 01/01/2019   Procedure: COLONOSCOPY WITH PROPOFOL;  Surgeon: Lollie Sails, MD;  Location: [facility] ENDOSCOPY;  Service: Endoscopy;  Laterality: N/A;   ESOPHAGOGASTRODUODENOSCOPY (EGD) WITH PROPOFOL N/A 11/06/2019   Procedure: ESOPHAGOGASTRODUODENOSCOPY (EGD) WITH PROPOFOL;  Surgeon: Robert Bellow, MD;  Location: [facility] ENDOSCOPY;  Service: Endoscopy;  Laterality: N/A;   KNEE ARTHROSCOPY Left    pins in toes Right    SHOULDER ARTHROSCOPY Right    TOTAL KNEE ARTHROPLASTY Left  09/15/2015   Procedure: TOTAL KNEE ARTHROPLASTY;  Surgeon: Hessie Knows, MD;  Location: [facility] ORS;  Service: Orthopedics;  Laterality: Left;    There were no vitals filed for this visit.    Subjective Assessment - 01/25/21 1059     Subjective BLE weakness    Pertinent History Hx of polio in childhood. Arrives complaining of bilateral LE weakness which has been worsening over the last 6 months, worse in RLE. He has significant RLE atrophy and RLE is 3/4" shorter than the LLE. Pt wears extra insoles to account for the length difference. He had a fall 6 months ago with a L shoulder injury (RTC tear). He has noticed more difficulty transferring from sit to stand with limited ability to push off with LUE secondary to injury. Pt reports R ankle achilles lengthening and arthrodesis at 75 years old. He has pain in R foot/ankle if he tries to walk on hardwood floor without shoes. 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 He has had 2 falls in the last 6 months (last one was a few days ago).    Limitations Standing;Other (comment)   Transfers   Diagnostic tests None reported    Patient Stated Goals Improve BLE strength    Currently in Pain? 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 Ronnald Collum    Onset Date/Surgical Date 07/24/20   Approximate   Next MD Visit Not reported    Prior Therapy Not reported for this specific issue      Precautions   Precautions Fall      Restrictions   Weight Bearing Restrictions No      Balance Screen   Has the patient fallen in the past 6 months Yes    How many times? 2    Has the patient had a decrease in activity level because of a fear of falling?  No    Is the patient reluctant to leave their home because of a fear of falling?  No      Standardized Balance Assessment   Standardized Balance Assessment Berg Balance Test       Berg Balance Test   Sit to Stand Able to stand without using hands and stabilize independently    Standing Unsupported Able to stand safely 2 minutes    Sitting with Back Unsupported but Feet Supported on Floor or Stool Able to sit safely and securely 2 minutes    Stand to Sit Sits safely with minimal use of hands    Transfers Able to transfer safely, minor use of hands    Standing Unsupported with Eyes Closed Able to stand 10 seconds with supervision    Standing Unsupported with Feet Together Able to place feet together independently and stand for 1 minute with supervision    From Standing, Reach Forward with Outstretched Arm Can reach forward >12 cm safely (5")    From Standing Position, Pick up Object from [place] to pick up shoe safely and easily    From Standing Position, Turn to Look Behind Over each Shoulder Looks behind from both sides and weight shifts well    Turn 360 Degrees Able to turn 360 degrees safely in 4 seconds or less    Standing Unsupported, Alternately Place Feet on Step/Stool Able to stand independently and safely and complete 8 steps in 20 seconds    Standing Unsupported, One Foot in Front Able to plae foot ahead of the other independently and hold 30 seconds    Standing on One Leg Tries to lift leg/unable to hold 3 seconds but remains standing independently    Total Score 49                SUBJECTIVE Chief complaint: Onset: Hx of polio in childhood. Arrives complaining of bilateral LE weakness which has been worsening over the last 6 months, worse in RLE. He has significant RLE atrophy and RLE is 3/4" shorter than the LLE. Pt wears extra in soles to account for the length difference. He had a fall 6 months ago with a L shoulder injury (RTC tear). He has noticed more difficulty transferring from sit to stand with limited ability to push off with LUE secondary to injury. Pt reports R ankle achilles lengthening and arthrodesis at 75 years old. He has pain in R  foot/ankle if he tries to walk on hardwood floor without shoes. 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 He has had 2 falls in the last 6 months (last one was a few days ago). Recent changes in overall health/medication: No Prior history of physical therapy for balance: None reported Follow-up appointment with MD: Not reported  OBJECTIVE  MUSCULOSKELETAL: Tremor: Absent Bulk: Significant atrophy of R thigh and R lower leg; Tone: Normal, no clonus observed  Posture Mild forward head and rounded shoulder but otherwise no gross abnormalities noted in standing or seated posture  Gait Decrease stance time on RLE with decreased L step length. Bilateral foot drop but significantly more evident on RLE;  Strength R/L 4-/4 Hip flexion 4/4+* Hip external rotation 4+/5 Hip internal rotation 4-/4 Hip extension  4-/4 Hip abduction 4-/4 Hip adduction 4-/5 Knee extension 4/4+ Knee flexion 4-/5 Ankle Dorsiflexion  AROM Limited R ankle DF range of motion, pt unable to reach neutral; Limited L knee flexion range of motion to around 90 degrees by gross estimation; Pt unable to reach full extension with L knee and appears to be lacking at least 5 degrees of extension;   NEUROLOGICAL:  Mental Status Patient is oriented to person, place and time.  Recent memory is intact.  Remote memory is intact.  Attention span and concentration are intact.  Expressive speech is intact.  Patient's fund of knowledge is within normal limits for educational level.  Cranial Nerves Deferred  Sensation Grossly diminished throughout RLE, worse more distal, closer to the R foot. Proprioception and hot/cold testing deferred on this date  Reflexes Deferred  Coordination/Cerebellar Deferred   Modified Clinical Test of Sensory Interaction for Balance    (CTSIB): Deferred   FUNCTIONAL OUTCOME MEASURES   Results Comments  BERG 49/56 Fall risk, in need of intervention  DGI NT   TUG  12.3 seconds WNL  5TSTS 19.4 seconds Above cut-off, in need of intervention  10 Meter Gait Speed Self-selected: 11.0s = 0.91 m/s; Fastest: 8.5s = 1.18 m/s Below normative values for full community ambulation  ABC Scale NT   FOTO 61 Predicted improvement to 62            Objective measurements completed on examination: See above findings.               PT Education - 01/25/21 1432     Education Details Plan of care    Person(s) Educated Patient    Methods Explanation    Comprehension Verbalized understanding              PT Short Term Goals - 01/25/21 1442       PT SHORT TERM GOAL #1   Title Pt will be independent with HEP in order to improve strength and balance in order to decrease fall risk and improve function at home and with leisure activities.    Time 6    Period Weeks    Status New    Target Date 03/08/21               PT Long Term Goals - 01/25/21 1444       PT LONG TERM GOAL #1   Title Pt will increase FOTO to at least 62 in order to demonstrate significant improvement in function related to LE weakness and imbalance.    Baseline 01/25/21: 61    Time 12    Period Weeks    Status New    Target Date 04/19/21      PT LONG TERM GOAL #2   Title Pt will decrease 5TSTS by at least 3 seconds in order to demonstrate clinically significant improvement in LE strength.    Baseline 01/25/21: 19.4s    Time 12    Period Weeks    Status New    Target Date 04/19/21  PT LONG TERM GOAL #3   Title Pt will improve BERG by at least 3 points in order to demonstrate clinically significant improvement in balance.    Baseline 01/25/21: 49    Time 12    Period Weeks    Status New    Target Date 04/19/21                    Plan - 01/25/21 1100     Clinical Impression Statement Pt is a pleasant 75 year-old male referred for BLE weakness. Pt with significant RLE atrophy s/p childhood polio with R ankle arthrodesis. Manual muscle testing  confirms notable weakness in RLE. Notable R foot drop during gait however due to limited ankle ROM unlikely to benefit from an AFO. His 5TSTS is 19.4s which is above cut-off and his BERG of 49/56 confirms mild/moderate balance deficits. Pt presents with deficits in strength, gait and balance and will benefit from skilled PT services to address these deficits to improve function at home and decrease risk for additional falls.    Personal Factors and Comorbidities Age;Past/Current Experience;Time since onset of injury/illness/exacerbation;Comorbidity 3+    Comorbidities Post-polio, OA, R ankle arthrodesis, L TKR    Examination-Activity Limitations Locomotion Level;Squat;Stairs;Stand    Examination-Participation Restrictions Community Activity;Shop;Yard Work    Merchant [employer] officer Evolving/Moderate complexity    Clinical Decision Making Moderate    Rehab Potential Fair    PT Frequency 2x / week    PT Duration 12 weeks    PT Treatment/Interventions ADLs/Self Care Home Management;Aquatic Therapy;Biofeedback;Canalith Repostioning;Iontophoresis '4mg'$ /ml Dexamethasone;Cryotherapy;Electrical Stimulation;Moist Heat;Traction;Ultrasound;DME Instruction;Gait training;Stair training;Functional mobility training;Therapeutic activities;Therapeutic exercise;Balance training;Neuromuscular re-education;Patient/family education;Manual techniques;Passive range of motion;Dry needling;Vestibular;Spinal Manipulations;Joint Manipulations    PT Next Visit Plan ABC, DGI, initiate HEP, strengthening/balance exercises    PT Home Exercise Plan None currently    Consulted and Agree with Plan of Care Patient             Patient will benefit from skilled therapeutic intervention in order to improve the following deficits and impairments:  Abnormal gait, Decreased range of motion, Decreased strength, Decreased balance  Visit Diagnosis: Muscle weakness (generalized)  Unsteadiness on feet  History of  falling     Problem List Patient Active Problem List   Diagnosis Date Noted   History of colon polyps    Family history of brain cancer    Family history of cancer    Family history of lung cancer    Primary osteoarthritis of left knee 09/15/2015   Phillips Grout PT, DPT, GCS  Jonatha Gagen 01/25/2021, 3:51 PM  [facility] [address] [place], [place], [address] Phone: [phone]   Fax:  [phone]  Name: Curtis Carroll MRN: [id number] Date of Birth: [birth year]

## 2021-02-01 ENCOUNTER — Ambulatory Visit: Payer: Medicare Other | Attending: Endocrinology

## 2021-02-01 ENCOUNTER — Other Ambulatory Visit: Payer: Self-pay

## 2021-02-01 DIAGNOSIS — R2681 Unsteadiness on feet: Secondary | ICD-10-CM

## 2021-02-01 DIAGNOSIS — M6281 Muscle weakness (generalized): Secondary | ICD-10-CM | POA: Diagnosis not present

## 2021-02-01 DIAGNOSIS — Z9181 History of falling: Secondary | ICD-10-CM | POA: Diagnosis present

## 2021-02-01 NOTE — Therapy (Signed)
[facility] [address] [place], [place], [address] Phone: [phone]   Fax:  [phone]  Physical Therapy Treatment  Patient Details  Name: Curtis Carroll MRN: [id number] Date of Birth: [birth year] Referring Provider (PT): Dr. Ronnald Collum   Encounter Date: 02/01/2021   PT End of Session - 02/01/21 1105     Visit Number 2    Number of Visits 25    Date for PT Re-Evaluation 04/19/21    Authorization Type eval: 01/25/21    PT Start Time 1102    PT Stop Time 1145    PT Time Calculation (min) 43 min    Equipment Utilized During Treatment Gait belt    Activity Tolerance Patient tolerated treatment well    Behavior During Therapy WFL for tasks assessed/performed             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   Chronic ankle pain    Colon polyp    Enthesopathy of ankle/tarsus    Family history of brain cancer    Family history of cancer    Family history of lung cancer    History of colon polyps    Hyperlipidemia    Hyperlipidemia    Hypertension    Polio     Past Surgical History:  Procedure Laterality Date   ANKLE ARTHROCENTESIS Right    COLONOSCOPY WITH PROPOFOL N/A 03/25/2017   Procedure: COLONOSCOPY WITH PROPOFOL;  Surgeon: Lollie Sails, MD;  Location: [facility] ENDOSCOPY;  Service: Endoscopy;  Laterality: N/A;   COLONOSCOPY WITH PROPOFOL N/A 01/01/2019   Procedure: COLONOSCOPY WITH PROPOFOL;  Surgeon: Lollie Sails, MD;  Location: [facility] ENDOSCOPY;  Service: Endoscopy;  Laterality: N/A;   ESOPHAGOGASTRODUODENOSCOPY (EGD) WITH PROPOFOL N/A 11/06/2019   Procedure: ESOPHAGOGASTRODUODENOSCOPY (EGD) WITH PROPOFOL;  Surgeon: Robert Bellow, MD;  Location: [facility] ENDOSCOPY;  Service: Endoscopy;  Laterality: N/A;   KNEE ARTHROSCOPY Left    pins in toes Right    SHOULDER ARTHROSCOPY Right    TOTAL KNEE ARTHROPLASTY Left  09/15/2015   Procedure: TOTAL KNEE ARTHROPLASTY;  Surgeon: Hessie Knows, MD;  Location: [facility] ORS;  Service: Orthopedics;  Laterality: Left;    There were no vitals filed for this visit.   Subjective Assessment - 02/01/21 1104     Subjective Pt reports that he is doing well today. He has been performing sit to stand exercise at home and feels like it has been helping his leg strength. He does notice some L shoulder pain while holding his arms extended out in front of him while performing. Otherwise no questions or concerns currently.    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 Arrives complaining of bilateral LE weakness which has been worsening over the last 6 months, worse in RLE. He has significant RLE atrophy and RLE is 3/4" shorter than the LLE. Pt wears extra insoles to account for the length difference. He had a fall 6 months ago with a L shoulder injury (RTC tear). He has noticed more difficulty transferring from sit to stand with limited ability to push off with LUE secondary to injury. Pt reports R ankle achilles lengthening and arthrodesis at 75 years old. He has pain in R foot/ankle if he tries to walk on hardwood floor without shoes. 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 He has had 2  falls in the last 6 months (last one was a few days ago).    Limitations Standing;Other (comment)   Transfers   Diagnostic tests None reported    Patient Stated Goals Improve BLE strength                [facility] PT Assessment - 02/01/21 0001       Standardized Balance Assessment   Standardized Balance Assessment Dynamic Gait Index      Dynamic Gait Index   Level Surface Normal    Change in Gait Speed Normal    Gait with Horizontal Head Turns Mild Impairment    Gait with Vertical Head Turns Normal    Gait and Pivot Turn Normal    Step Over Obstacle Normal    Step Around Obstacles Normal    Steps Moderate Impairment    Total Score 21                TREATMENT   Ther-ex  NuStep L2-3 x 5 minutes for warm-up during interval history; Sit to stand without UE support x 10, occasional minA+1 from therapist to stabilize;  Standing exercises with 3# ankle weights (AW): Hip flexion marching x 1 minute; Hip abduction x 1 minute; HS curls x 1 minute; Hip extension x 1 minute;  Seated LAQ with 3# AW x 1 minute; Seated clams with red tband resistance x1 minute; Seated adductor ball squeezes x1 minute;  6 inch step ups with no upper extremity support when leading with left lower extremity and minimal upper extremity support when leading with right lower extremity x10 each;   Neuromuscular Re-education  Updated outcome measures with patient: ABC: 67.5% DGI: 21/24   Pt educated throughout session about proper posture and technique with exercises. Improved exercise technique, movement at target joints, use of target muscles after min to mod verbal, visual, tactile cues.    Updated additional outcome measures with patient.  He scored 67.5% on the ABC scale and demonstrated mild balance deficits with a score of 21/24 on the DGI.  Initiated strengthening with patient during session today and HEP furnished.  Patient encouraged to follow-up scheduled. Pt will benefit from PT services to address deficits in strength, balance, and mobility in order to return to full function at home.                   PT Short Term Goals - 01/25/21 1442       PT SHORT TERM GOAL #1   Title Pt will be independent with HEP in order to improve strength and balance in order to decrease fall risk and improve function at home and with leisure activities.    Time 6    Period Weeks    Status New    Target Date 03/08/21               PT Long Term Goals - 01/25/21 1444       PT LONG TERM GOAL #1   Title Pt will increase FOTO to at least 62 in order to demonstrate significant improvement in function related to LE weakness and imbalance.     Baseline 01/25/21: 61    Time 12    Period Weeks    Status New    Target Date 04/19/21      PT LONG TERM GOAL #2   Title Pt will decrease 5TSTS by at least 3 seconds in order to demonstrate clinically significant improvement in LE strength.    Baseline 01/25/21:  19.4s    Time 12    Period Weeks    Status New    Target Date 04/19/21      PT LONG TERM GOAL #3   Title Pt will improve BERG by at least 3 points in order to demonstrate clinically significant improvement in balance.    Baseline 01/25/21: 49    Time 12    Period Weeks    Status New    Target Date 04/19/21                   Plan - 02/01/21 1105     Clinical Impression Statement Updated additional outcome measures with patient.  He scored 67.5% on the ABC scale and demonstrated mild balance deficits with a score of 21/24 on the DGI.  Initiated strengthening with patient during session today and HEP furnished.  Patient encouraged to follow-up scheduled. Pt will benefit from PT services to address deficits in strength, balance, and mobility in order to return to full function at home.    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    PT Frequency 2x / week    PT Duration 12 weeks    PT Treatment/Interventions ADLs/Self Care Home Management;Aquatic Therapy;Biofeedback;Canalith Repostioning;Iontophoresis '4mg'$ /ml Dexamethasone;Cryotherapy;Electrical Stimulation;Moist Heat;Traction;Ultrasound;DME Instruction;Gait training;Stair training;Functional mobility training;Therapeutic activities;Therapeutic exercise;Balance training;Neuromuscular  re-education;Patient/family education;Manual techniques;Passive range of motion;Dry needling;Vestibular;Spinal Manipulations;Joint Manipulations    PT Next Visit Plan strengthening/balance exercises    PT Home Exercise Plan Access Code: [email or web address]    Consulted and Agree with Plan of Care Patient             Patient will benefit from skilled therapeutic intervention in order to improve the following deficits and impairments:  Abnormal gait, Decreased range of motion, Decreased strength, Decreased balance  Visit Diagnosis: Muscle weakness (generalized)  Unsteadiness on feet     Problem List Patient Active Problem List   Diagnosis Date Noted   History of colon polyps    Family history of brain cancer    Family history of cancer    Family history of lung cancer    Primary osteoarthritis of left knee 09/15/2015   Phillips Grout PT, DPT, GCS  Les Longmore, PT 02/01/2021, 2:20 PM  [place] [facility] [address] [place], [place], [address] Phone: [phone]   Fax:  [phone]  Name: Curtis Carroll MRN: [id number] Date of Birth: March 07, 1946

## 2021-02-01 NOTE — Patient Instructions (Signed)
Access Code: [email or web address] URL: [email or web address] Date: 02/01/2021 Prepared by: Roxana Hires  Exercises Sit to Stand with Arms Crossed - 1 x daily - 7 x weekly - 2 sets - 10 reps Mini Squat with Counter Support - 1 x daily - 7 x weekly - 2 sets - 10 reps Seated Hip Abduction with Resistance - 1 x daily - 7 x weekly - 2 sets - 10 reps - 3s hold Seated Hip Adduction Squeeze with Ball - 1 x daily - 7 x weekly - 2 sets - 10 reps - 3s hold

## 2021-02-06 ENCOUNTER — Ambulatory Visit: Payer: Medicare Other | Admitting: Physical Therapy

## 2021-02-06 ENCOUNTER — Other Ambulatory Visit: Payer: Self-pay

## 2021-02-06 DIAGNOSIS — M6281 Muscle weakness (generalized): Secondary | ICD-10-CM

## 2021-02-06 DIAGNOSIS — R2681 Unsteadiness on feet: Secondary | ICD-10-CM

## 2021-02-06 DIAGNOSIS — Z9181 History of falling: Secondary | ICD-10-CM

## 2021-02-06 NOTE — Therapy (Signed)
[facility] [address] [place], [place], [address] Phone: [phone]   Fax:  [phone]  Physical Therapy Treatment  Patient Details  Name: Curtis Carroll MRN: [id number] Date of Birth: 1945/07/17 Referring Provider (PT): Dr. 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   Chronic ankle pain    Colon polyp    Enthesopathy of ankle/tarsus    Family history of brain cancer    Family history of cancer    Family history of lung cancer    History of colon polyps    Hyperlipidemia    Hyperlipidemia    Hypertension    Polio     Past Surgical History:  Procedure Laterality Date   ANKLE ARTHROCENTESIS Right    COLONOSCOPY WITH PROPOFOL N/A 03/25/2017   Procedure: COLONOSCOPY WITH PROPOFOL;  Surgeon: Lollie Sails, MD;  Location: [facility] ENDOSCOPY;  Service: Endoscopy;  Laterality: N/A;   COLONOSCOPY WITH PROPOFOL N/A 01/01/2019   Procedure: COLONOSCOPY WITH PROPOFOL;  Surgeon: Lollie Sails, MD;  Location: [facility] ENDOSCOPY;  Service: Endoscopy;  Laterality: N/A;   ESOPHAGOGASTRODUODENOSCOPY (EGD) WITH PROPOFOL N/A 11/06/2019   Procedure: ESOPHAGOGASTRODUODENOSCOPY (EGD) WITH PROPOFOL;  Surgeon: Robert Bellow, MD;  Location: [facility] ENDOSCOPY;  Service: Endoscopy;  Laterality: N/A;   KNEE ARTHROSCOPY Left    pins in toes Right    SHOULDER ARTHROSCOPY Right    TOTAL KNEE ARTHROPLASTY Left  09/15/2015   Procedure: TOTAL KNEE ARTHROPLASTY;  Surgeon: Hessie Knows, MD;  Location: [facility] ORS;  Service: Orthopedics;  Laterality: Left;    There were no vitals filed for this visit.   Subjective Assessment - 02/06/21 1208     Subjective Reports he is doing well today. He has progressed his STS exercise by lowering the height of his chair. No questions or concerns regarding his HEP.    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 Arrives complaining of bilateral LE weakness which has been worsening over the last 6 months, worse in RLE. He has significant RLE atrophy and RLE is 3/4" shorter than the LLE. Pt wears extra insoles to account for the length difference. He had a fall 6 months ago with a L shoulder injury (RTC tear). He has noticed more difficulty transferring from sit to stand with limited ability to push off with LUE secondary to injury. Pt reports R ankle achilles lengthening and arthrodesis at 75 years old. He has pain in R foot/ankle if he tries to walk on hardwood floor without shoes. 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 He has had 2 falls in the last 6 months (last one was a few days ago).    Limitations Standing;Other (comment)    Diagnostic tests None reported  Currently in Pain? No/denies               TREATMENT     Ther-ex  NuStep L2-3 x 5 minutes for warm-up during interval history; Sit to stand without UE support x 10, occasional CGA from therapist to stabilize;   Standing exercises with 3# ankle weights (AW): Hip flexion marching x 1 minute; Hip abduction x 1 minute; HS curls x 1 minute; Hip extension x 1 minute;   Seated LAQ with 3# AW x 1 minute; Seated clams with red tband resistance x1 minute; Seated adductor ball squeezes x1 minute;   Squats at bar, bilateral heels elevated with airex pad, 2 x 10 reps;     Neuromuscular Re-education  Romberg stance, feet together 1 minute, no UE support; Romberg stance, feet together eyes  closed, 1 minute, no UE support and CGA for minimal steadying; Tandem stance 2 x 1 minute each LE, CGA for moderate steadying;       Pt educated throughout session about proper posture and technique with exercises. Improved exercise technique, movement at target joints, use of target muscles after min to mod verbal, visual, tactile cues.      Pt demonstrates excellent motivation in today's session. Continued strengthening exercises with patient and initiated standing balance challenges. He required steadying assist during tandem stance, primarily with LLE forward due to leftward lean and RLE unable to remain flat on ground. Pt educated on proper form during squats in HEP for decreased pain in L knee. Patient encouraged to follow-up scheduled. Pt will benefit from PT services to address deficits in strength, balance, and mobility in order to return to full function at home.          PT Short Term Goals - 01/25/21 1442       PT SHORT TERM GOAL #1   Title Pt will be independent with HEP in order to improve strength and balance in order to decrease fall risk and improve function at home and with leisure activities.    Time 6    Period Weeks    Status New    Target Date 03/08/21               PT Long Term Goals - 01/25/21 1444       PT LONG TERM GOAL #1   Title Pt will increase FOTO to at least 62 in order to demonstrate significant improvement in function related to LE weakness and imbalance.    Baseline 01/25/21: 61    Time 12    Period Weeks    Status New    Target Date 04/19/21      PT LONG TERM GOAL #2   Title Pt will decrease 5TSTS by at least 3 seconds in order to demonstrate clinically significant improvement in LE strength.    Baseline 01/25/21: 19.4s    Time 12    Period Weeks    Status New    Target Date 04/19/21      PT LONG TERM GOAL #3   Title Pt will improve BERG by at least 3 points in order to demonstrate clinically significant improvement in balance.     Baseline 01/25/21: 49    Time 12    Period Weeks    Status New    Target Date 04/19/21                   Plan - 02/06/21 1302     Clinical Impression Statement Pt demonstrates excellent  motivation in today's session. Continued strengthening exercises with patient and initiated standing balance challenges. He required steadying assist during tandem stance, primarily with LLE forward due to leftward lean and RLE unable to remain flat on ground. Pt educated on proper form during squats in HEP for decreased pain in L knee. Patient encouraged to follow-up scheduled. Pt will benefit from PT services to address deficits in strength, balance, and mobility in order to return to full function at home.    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    PT Frequency 2x / week    PT Duration 12 weeks    PT Treatment/Interventions ADLs/Self Care Home Management;Aquatic Therapy;Biofeedback;Canalith Repostioning;Iontophoresis '4mg'$ /ml Dexamethasone;Cryotherapy;Electrical Stimulation;Moist Heat;Traction;Ultrasound;DME Instruction;Gait training;Stair training;Functional mobility training;Therapeutic activities;Therapeutic exercise;Balance training;Neuromuscular re-education;Patient/family education;Manual techniques;Passive range of motion;Dry needling;Vestibular;Spinal Manipulations;Joint Manipulations    PT Next Visit Plan strengthening/balance exercises    PT Home Exercise Plan Access Code: [email or web address]    Consulted and Agree with Plan of Care Patient             Patient will benefit from skilled therapeutic intervention in  order to improve the following deficits and impairments:  Abnormal gait, Decreased range of motion, Decreased strength, Decreased balance  Visit Diagnosis: Muscle weakness (generalized)  Unsteadiness on feet  History of falling     Problem List Patient Active Problem List   Diagnosis Date Noted   History of colon polyps    Family history of brain cancer    Family history of cancer    Family history of lung cancer    Primary osteoarthritis of left knee 09/15/2015    Ramonita Lab, PT 02/06/2021, 1:06 PM  [facility] [address] [place], [place], [address] Phone: [phone]   Fax:  [phone]  Name: Curtis Carroll MRN: [id number] Date of Birth: 06/10/1945

## 2021-02-08 ENCOUNTER — Ambulatory Visit: Payer: Medicare Other

## 2021-02-08 ENCOUNTER — Other Ambulatory Visit: Payer: Self-pay

## 2021-02-08 DIAGNOSIS — M6281 Muscle weakness (generalized): Secondary | ICD-10-CM

## 2021-02-08 DIAGNOSIS — R2681 Unsteadiness on feet: Secondary | ICD-10-CM

## 2021-02-08 NOTE — Therapy (Signed)
[facility] [address] [place], [place], [address] Phone: [phone]   Fax:  [phone]  Physical Therapy Treatment  Patient Details  Name: Curtis Carroll MRN: [id number] Date of Birth: 08-24-1945 Referring Provider (PT): Dr. 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   Chronic ankle pain    Colon polyp    Enthesopathy of ankle/tarsus    Family history of brain cancer    Family history of cancer    Family history of lung cancer    History of colon polyps    Hyperlipidemia    Hyperlipidemia    Hypertension    Polio     Past Surgical History:  Procedure Laterality Date   ANKLE ARTHROCENTESIS Right    COLONOSCOPY WITH PROPOFOL N/A 03/25/2017   Procedure: COLONOSCOPY WITH PROPOFOL;  Surgeon: Lollie Sails, MD;  Location: [facility] ENDOSCOPY;  Service: Endoscopy;  Laterality: N/A;   COLONOSCOPY WITH PROPOFOL N/A 01/01/2019   Procedure: COLONOSCOPY WITH PROPOFOL;  Surgeon: Lollie Sails, MD;  Location: [facility] ENDOSCOPY;  Service: Endoscopy;  Laterality: N/A;   ESOPHAGOGASTRODUODENOSCOPY (EGD) WITH PROPOFOL N/A 11/06/2019   Procedure: ESOPHAGOGASTRODUODENOSCOPY (EGD) WITH PROPOFOL;  Surgeon: Robert Bellow, MD;  Location: [facility] ENDOSCOPY;  Service: Endoscopy;  Laterality: N/A;   KNEE ARTHROSCOPY Left    pins in toes Right    SHOULDER ARTHROSCOPY Right    TOTAL KNEE ARTHROPLASTY Left  09/15/2015   Procedure: TOTAL KNEE ARTHROPLASTY;  Surgeon: Hessie Knows, MD;  Location: [facility] ORS;  Service: Orthopedics;  Laterality: Left;    There were no vitals filed for this visit.   Subjective Assessment - 02/08/21 1059     Subjective Reports he is doing well today. He has noticed improvement in his leg strength with sit to stand exercise. 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 Arrives complaining of bilateral LE weakness which has been worsening over the last 6 months, worse in RLE. He has significant RLE atrophy and RLE is 3/4" shorter than the LLE. Pt wears extra insoles to account for the length difference. He had a fall 6 months ago with a L shoulder injury (RTC tear). He has noticed more difficulty transferring from sit to stand with limited ability to push off with LUE secondary to injury. Pt reports R ankle achilles lengthening and arthrodesis at 75 years old. He has pain in R foot/ankle if he tries to walk on hardwood floor without shoes. 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 He has had 2 falls in the last 6 months (last one was  a few days ago).    Limitations Standing;Other (comment)    Diagnostic tests None reported                    TREATMENT   Ther-ex  NuStep L2 x 5 minutes BLE only for warm-up during interval history (4 minutes unbilled); 6" alternating forward step-ups x 10 BLE, LUE support utilized when stepping with RLE; 6" lateral step-ups x 10 BLE, 1-2 UE support on both sides to stabilize; Sit to stand without UE support 2 x 10, occasional minA+1 from therapist to stabilize but notably improved from prior sessions;  Standing exercises with 4# ankle weights (AW): Hip flexion marching x 1 minute; Hip abduction x 1 minute; HS curls x 1 minute;  Seated LAQ with 4# AW x 1 minute; Seated clams  with blue tband resistance x1 minute; Seated adductor ball squeezes x1 minute;   Neuromuscular Re-education  Airex feet together eyes closed; Airex alternating 6" cone taps x 10 on each side; Airex feet together ball passes around body with therapist with head/eye follow x multiple bouts to each side; Airex vertical ball tosses to self with feet together and head/eye follow x 30s;   Pt educated throughout session about proper posture and technique with exercises. Improved exercise technique, movement at target joints, use of target muscles after min to mod verbal, visual, tactile cues.    Patient demonstrates excellent motivation during session today. His BLE strength is improving as evidenced by improved ability to complete sit to stand from regular height chair. He is able to complete two sets of ten repetitions during session today. Continued with LE strengthening and also incorporated some balance exercises. He struggles with balance on unstable Airex pad when incorporating dynamic tasks such as toe taps and ball passes. Patient encouraged to follow-up scheduled. Pt will benefit from PT services to address deficits in strength, balance, and mobility in order to return to full function at home.                   PT Short Term Goals - 01/25/21 1442       PT SHORT TERM GOAL #1   Title Pt will be independent with HEP in order to improve strength and balance in order to decrease fall risk and improve function at home and with leisure activities.    Time 6    Period Weeks    Status New    Target Date 03/08/21               PT Long Term Goals - 01/25/21 1444       PT LONG TERM GOAL #1   Title Pt will increase FOTO to at least 62 in order to demonstrate significant improvement in function related to LE weakness and imbalance.    Baseline 01/25/21: 61    Time 12    Period Weeks    Status New    Target Date 04/19/21      PT LONG TERM GOAL #2   Title Pt will  decrease 5TSTS by at least 3 seconds in order to demonstrate clinically significant improvement in LE strength.    Baseline 01/25/21: 19.4s    Time 12    Period Weeks    Status New    Target Date 04/19/21      PT LONG TERM GOAL #3   Title Pt will improve BERG by at least 3 points in order to demonstrate clinically significant improvement in balance.  Baseline 01/25/21: 49    Time 12    Period Weeks    Status New    Target Date 04/19/21                   Plan - 02/08/21 1059     Clinical Impression Statement Patient demonstrates excellent motivation during session today. His BLE strength is improving as evidenced by improved ability to complete sit to stand from regular height chair. He is able to complete two sets of ten repetitions during session today. Continued with LE strengthening and also incorporated some balance exercises. He struggles with balance on unstable Airex pad when incorporating dynamic tasks such as toe taps and ball passes. Patient encouraged to follow-up scheduled. Pt will benefit from PT services to address deficits in strength, balance, and mobility in order to return to full function at home.    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    PT Frequency 2x / week    PT Duration 12 weeks    PT Treatment/Interventions ADLs/Self Care Home Management;Aquatic Therapy;Biofeedback;Canalith Repostioning;Iontophoresis '4mg'$ /ml Dexamethasone;Cryotherapy;Electrical Stimulation;Moist Heat;Traction;Ultrasound;DME Instruction;Gait training;Stair training;Functional mobility training;Therapeutic  activities;Therapeutic exercise;Balance training;Neuromuscular re-education;Patient/family education;Manual techniques;Passive range of motion;Dry needling;Vestibular;Spinal Manipulations;Joint Manipulations    PT Next Visit Plan strengthening/balance exercises    PT Home Exercise Plan Access Code: [email or web address]    Consulted and Agree with Plan of Care Patient             Patient will benefit from skilled therapeutic intervention in order to improve the following deficits and impairments:  Abnormal gait, Decreased range of motion, Decreased strength, Decreased balance  Visit Diagnosis: Muscle weakness (generalized)  Unsteadiness on feet     Problem List Patient Active Problem List   Diagnosis Date Noted   History of colon polyps    Family history of brain cancer    Family history of cancer    Family history of lung cancer    Primary osteoarthritis of left knee 09/15/2015   Phillips Grout PT, DPT, GCS  Liban Guedes, PT 02/08/2021, 9:28 PM  [facility] [address] [place], [place], [address] Phone: [phone]   Fax:  [phone]  Name: Curtis Carroll MRN: [id number] Date of Birth: 1945-10-19

## 2021-02-13 ENCOUNTER — Other Ambulatory Visit: Payer: Self-pay

## 2021-02-13 ENCOUNTER — Ambulatory Visit: Payer: Medicare Other

## 2021-02-13 DIAGNOSIS — M6281 Muscle weakness (generalized): Secondary | ICD-10-CM | POA: Diagnosis not present

## 2021-02-13 DIAGNOSIS — R2681 Unsteadiness on feet: Secondary | ICD-10-CM

## 2021-02-13 NOTE — Therapy (Signed)
[facility] [address] [place], [place], [address] Phone: [phone]   Fax:  [phone]  Physical Therapy Treatment  Patient Details  Name: Curtis Carroll MRN: [id number] Date of Birth: [birth year] Referring Provider (PT): Dr. 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   Chronic ankle pain    Colon polyp    Enthesopathy of ankle/tarsus    Family history of brain cancer    Family history of cancer    Family history of lung cancer    History of colon polyps    Hyperlipidemia    Hyperlipidemia    Hypertension    Polio     Past Surgical History:  Procedure Laterality Date   ANKLE ARTHROCENTESIS Right    COLONOSCOPY WITH PROPOFOL N/A 03/25/2017   Procedure: COLONOSCOPY WITH PROPOFOL;  Surgeon: Lollie Sails, MD;  Location: [facility] ENDOSCOPY;  Service: Endoscopy;  Laterality: N/A;   COLONOSCOPY WITH PROPOFOL N/A 01/01/2019   Procedure: COLONOSCOPY WITH PROPOFOL;  Surgeon: Lollie Sails, MD;  Location: [facility] ENDOSCOPY;  Service: Endoscopy;  Laterality: N/A;   ESOPHAGOGASTRODUODENOSCOPY (EGD) WITH PROPOFOL N/A 11/06/2019   Procedure: ESOPHAGOGASTRODUODENOSCOPY (EGD) WITH PROPOFOL;  Surgeon: Robert Bellow, MD;  Location: [facility] ENDOSCOPY;  Service: Endoscopy;  Laterality: N/A;   KNEE ARTHROSCOPY Left    pins in toes Right    SHOULDER ARTHROSCOPY Right    TOTAL KNEE ARTHROPLASTY Left  09/15/2015   Procedure: TOTAL KNEE ARTHROPLASTY;  Surgeon: Hessie Knows, MD;  Location: [facility] ORS;  Service: Orthopedics;  Laterality: Left;    There were no vitals filed for this visit.   Subjective Assessment - 02/13/21 1059     Subjective Pt reports he is doing well today. He reports continued improvement in lower extremity strength. No pain reported upon arrival.  No specific questions or concerns.    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 Arrives complaining of bilateral LE weakness which has been worsening over the last 6 months, worse in RLE. He has significant RLE atrophy and RLE is 3/4" shorter than the LLE. Pt wears extra insoles to account for the length difference. He had a fall 6 months ago with a L shoulder injury (RTC tear). He has noticed more difficulty transferring from sit to stand with limited ability to push off with LUE secondary to injury. Pt reports R ankle achilles lengthening and arthrodesis at 75 years old. He has pain in R foot/ankle if he tries to walk on hardwood floor without shoes. 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 He has had 2 falls in the last 6 months (last one was a few days ago).    Limitations Standing;Other (comment)    Diagnostic tests None reported  TREATMENT   Ther-ex  NuStep L2-4 x 5 minutes BLE only for warm-up during interval history (4 minutes unbilled); Mini squats 2 x 10 with R heel on Airex pad; Forward BOSU lunges alternating leading LE without UE support x 10 each; Sit to stand without UE support 2 x 10, occasional minA+1 from therapist to stabilize but notably improved from prior sessions; Resisted side stepping with blue tband around knees in parallel bars 4 lengths x 2;  Standing exercises with 4# ankle weights (AW): Hip flexion marching x 1 minute; Hip abduction x 1 minute; HS curls x 1 minute;  Seated LAQ with 4# AW x 1 minute; Seated clams with blue tband resistance x1  minute; Seated adductor ball squeezes x1 minute;   Neuromuscular Re-education  Airex feet together eyes open/closed; Airex feet together horizontal/vertical head turns; Airex tandem balance alternating forward lower extremity; Multiple bouts performed of each exercise;   Pt educated throughout session about proper posture and technique with exercises. Improved exercise technique, movement at target joints, use of target muscles after min to mod verbal, visual, tactile cues.    Patient demonstrates excellent motivation during session today. His BLE strength is improving as evidenced by improved ability to complete sit to stand from regular height chair. Continued with LE strengthening and added some new challenging exercise such as BOSU lunges and resisted sidestepping.  Continue to challenge balance on unstable surface which is very difficult for patient. Patient encouraged to follow-up scheduled.  He will benefit from PT services to address deficits in strength, balance, and mobility in order to return to full function at home.                   PT Short Term Goals - 01/25/21 1442       PT SHORT TERM GOAL #1   Title Pt will be independent with HEP in order to improve strength and balance in order to decrease fall risk and improve function at home and with leisure activities.    Time 6    Period Weeks    Status New    Target Date 03/08/21               PT Long Term Goals - 01/25/21 1444       PT LONG TERM GOAL #1   Title Pt will increase FOTO to at least 62 in order to demonstrate significant improvement in function related to LE weakness and imbalance.    Baseline 01/25/21: 61    Time 12    Period Weeks    Status New    Target Date 04/19/21      PT LONG TERM GOAL #2   Title Pt will decrease 5TSTS by at least 3 seconds in order to demonstrate clinically significant improvement in LE strength.    Baseline 01/25/21: 19.4s    Time 12    Period Weeks     Status New    Target Date 04/19/21      PT LONG TERM GOAL #3   Title Pt will improve BERG by at least 3 points in order to demonstrate clinically significant improvement in balance.    Baseline 01/25/21: 49    Time 12    Period Weeks    Status New    Target Date 04/19/21                   Plan - 02/13/21 1628     Clinical Impression Statement Patient demonstrates excellent motivation  during session today. His BLE strength is improving as evidenced by improved ability to complete sit to stand from regular height chair. Continued with LE strengthening and added some new challenging exercise such as BOSU lunges and resisted sidestepping.  Continue to challenge balance on unstable surface which is very difficult for patient. Patient encouraged to follow-up scheduled.  He will benefit from PT services to address deficits in strength, balance, and mobility in order to return to full function at home.    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    PT Frequency 2x / week    PT Duration 12 weeks    PT Treatment/Interventions ADLs/Self Care Home Management;Aquatic Therapy;Biofeedback;Canalith Repostioning;Iontophoresis '4mg'$ /ml Dexamethasone;Cryotherapy;Electrical Stimulation;Moist Heat;Traction;Ultrasound;DME Instruction;Gait training;Stair training;Functional mobility training;Therapeutic activities;Therapeutic exercise;Balance training;Neuromuscular re-education;Patient/family education;Manual techniques;Passive range of motion;Dry needling;Vestibular;Spinal Manipulations;Joint Manipulations    PT Next Visit Plan  strengthening/balance exercises    PT Home Exercise Plan Access Code: [email or web address]    Consulted and Agree with Plan of Care Patient              Patient will benefit from skilled therapeutic intervention in order to improve the following deficits and impairments:  Abnormal gait, Decreased range of motion, Decreased strength, Decreased balance  Visit Diagnosis: Muscle weakness (generalized)  Unsteadiness on feet     Problem List Patient Active Problem List   Diagnosis Date Noted   History of colon polyps    Family history of brain cancer    Family history of cancer    Family history of lung cancer    Primary osteoarthritis of left knee 09/15/2015   Phillips Grout PT, DPT, GCS  Indya Oliveria, PT 02/13/2021, 4:31 PM  [facility] [address] [place], [place], [address] Phone: [phone]   Fax:  [phone]  Name: DALBERT DIPONIO MRN: [id number] Date of Birth: Apr 10, 1946

## 2021-02-15 ENCOUNTER — Other Ambulatory Visit: Payer: Self-pay

## 2021-02-15 ENCOUNTER — Ambulatory Visit: Payer: Medicare Other

## 2021-02-15 DIAGNOSIS — M6281 Muscle weakness (generalized): Secondary | ICD-10-CM | POA: Diagnosis not present

## 2021-02-15 DIAGNOSIS — R2681 Unsteadiness on feet: Secondary | ICD-10-CM

## 2021-02-15 NOTE — Therapy (Signed)
[facility] [address] [place], [place], [address] Phone: [phone]   Fax:  [phone]  Physical Therapy Treatment  Patient Details  Name: Curtis Carroll MRN: [id number] Date of Birth: 06-23-1945 Referring Provider (PT): Dr. 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   Chronic ankle pain    Colon polyp    Enthesopathy of ankle/tarsus    Family history of brain cancer    Family history of cancer    Family history of lung cancer    History of colon polyps    Hyperlipidemia    Hyperlipidemia    Hypertension    Polio     Past Surgical History:  Procedure Laterality Date   ANKLE ARTHROCENTESIS Right    COLONOSCOPY WITH PROPOFOL N/A 03/25/2017   Procedure: COLONOSCOPY WITH PROPOFOL;  Surgeon: Lollie Sails, MD;  Location[facility] ENDOSCOPY;  Service: Endoscopy;  Laterality: N/A;   COLONOSCOPY WITH PROPOFOL N/A 01/01/2019   Procedure: COLONOSCOPY WITH PROPOFOL;  Surgeon: Lollie Sails, MD;  Location: [facility] ENDOSCOPY;  Service: Endoscopy;  Laterality: N/A;   ESOPHAGOGASTRODUODENOSCOPY (EGD) WITH PROPOFOL N/A 11/06/2019   Procedure: ESOPHAGOGASTRODUODENOSCOPY (EGD) WITH PROPOFOL;  Surgeon: Robert Bellow, MD;  Location: [facility] ENDOSCOPY;  Service: Endoscopy;  Laterality: N/A;   KNEE ARTHROSCOPY Left    pins in toes Right    SHOULDER ARTHROSCOPY Right    TOTAL KNEE ARTHROPLASTY Left  09/15/2015   Procedure: TOTAL KNEE ARTHROPLASTY;  Surgeon: Hessie Knows, MD;  Location: [facility] ORS;  Service: Orthopedics;  Laterality: Left;    There were no vitals filed for this visit.   Subjective Assessment - 02/15/21 1057     Subjective Pt reports he is doing well today. He reports continued improvement in lower extremity strength. No pain reported upon arrival.  No specific questions or concerns.    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 Arrives complaining of bilateral LE weakness which has been worsening over the last 6 months, worse in RLE. He has significant RLE atrophy and RLE is 3/4" shorter than the LLE. Pt wears extra insoles to account for the length difference. He had a fall 6 months ago with a L shoulder injury (RTC tear). He has noticed more difficulty transferring from sit to stand with limited ability to push off with LUE secondary to injury. Pt reports R ankle achilles lengthening and arthrodesis at 75 years old. He has pain in R foot/ankle if he tries to walk on hardwood floor without shoes. 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 He has had 2 falls in the last 6 months (last one was a few days ago).    Limitations Standing;Other (comment)    Diagnostic tests None reported  TREATMENT   Ther-ex  NuStep L2-5 x 5 minutes BLE only for warm-up during interval history (3 minutes unbilled); Sit to stand without UE support for speed x 5, x 10; Mini squats 2 x 10 with R heel on Airex pad;  Standing exercises with 4# ankle weights (AW): Hip flexion marching x 1 minute; Hip abduction x 1 minute; HS curls x 1 minute; Hip extension x 1 minute;  Seated LAQ with 4# AW x 1 minute; Seated clams with blue tband resistance x1 minute; Seated adductor ball squeezes x1 minute;  Forward 6 inch step ups without upper extremity support when leading with left lower extremity and minimal extremity support leading with right lower extremity x10  BLE; Lateral 6 inch step ups without upper extremity support when leading with left lower extremity and minimal extremity support leading with right lower extremity x10 BLE;   Neuromuscular Re-education  1/2 bolster A/P static balance x 60s; 1/2 bolser A/P ant/post weight shifts; 1/2 bolster tandem balance alternating forward LE x 30s each; Tandem gait in // bars x 6 lengths; Airex feet together eyes closed x 30s; Airex feet together with ball passes around body with head/eye follow x 10 on each side;   Pt educated throughout session about proper posture and technique with exercises. Improved exercise technique, movement at target joints, use of target muscles after min to mod verbal, visual, tactile cues.    Patient demonstrates excellent motivation during session today.  He is able to perform sit to stand exercise today under time constraints with significant improvement compared to initial evaluation. Continued with LE strengthening as well as balance exercises.  Challenged balance today using unstable half bolster with flat side up.  Patient struggles with both anterior posterior medial lateral stability. Patient encouraged to follow-up scheduled.  He will benefit from PT services to address deficits in strength, balance, and mobility in order to return to full function at home.                   PT Short Term Goals - 01/25/21 1442       PT SHORT TERM GOAL #1   Title Pt will be independent with HEP in order to improve strength and balance in order to decrease fall risk and improve function at home and with leisure activities.    Time 6    Period Weeks    Status New    Target Date 03/08/21               PT Long Term Goals - 01/25/21 1444       PT LONG TERM GOAL #1   Title Pt will increase FOTO to at least 62 in order to demonstrate significant improvement in function related to LE weakness and imbalance.    Baseline 01/25/21: 61    Time 12    Period Weeks     Status New    Target Date 04/19/21      PT LONG TERM GOAL #2   Title Pt will decrease 5TSTS by at least 3 seconds in order to demonstrate clinically significant improvement in LE strength.    Baseline 01/25/21: 19.4s    Time 12    Period Weeks    Status New    Target Date 04/19/21      PT LONG TERM GOAL #3   Title Pt will improve BERG by at least 3 points in order to demonstrate clinically significant improvement in balance.    Baseline 01/25/21: 49  Time 12    Period Weeks    Status New    Target Date 04/19/21                   Plan - 02/15/21 1057     Clinical Impression Statement Patient demonstrates excellent motivation during session today.  He is able to perform sit to stand exercise today under time constraints with significant improvement compared to initial evaluation. Continued with LE strengthening as well as balance exercises.  Challenged balance today using unstable half bolster with flat side up.  Patient struggles with both anterior posterior medial lateral stability. Patient encouraged to follow-up scheduled.  He will benefit from PT services to address deficits in strength, balance, and mobility in order to return to full function at home.    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    PT Frequency 2x / week    PT Duration 12 weeks    PT Treatment/Interventions ADLs/Self Care Home Management;Aquatic Therapy;Biofeedback;Canalith Repostioning;Iontophoresis 4mg /ml Dexamethasone;Cryotherapy;Electrical Stimulation;Moist Heat;Traction;Ultrasound;DME Instruction;Gait training;Stair  training;Functional mobility training;Therapeutic activities;Therapeutic exercise;Balance training;Neuromuscular re-education;Patient/family education;Manual techniques;Passive range of motion;Dry needling;Vestibular;Spinal Manipulations;Joint Manipulations    PT Next Visit Plan strengthening/balance exercises    PT Home Exercise Plan Access Code: [email or web address]    Consulted and Agree with Plan of Care Patient              Patient will benefit from skilled therapeutic intervention in order to improve the following deficits and impairments:  Abnormal gait, Decreased range of motion, Decreased strength, Decreased balance  Visit Diagnosis: Muscle weakness (generalized)  Unsteadiness on feet     Problem List Patient Active Problem List   Diagnosis Date Noted   History of colon polyps    Family history of brain cancer    Family history of cancer    Family history of lung cancer    Primary osteoarthritis of left knee 09/15/2015   Phillips Grout PT, DPT, GCS  Salik Grewell, PT 02/15/2021, 1:56 PM  [facility] [address] [place], [place], [address] Phone: [phone]   Fax:  [phone]  Name: Curtis Carroll MRN: [id number] Date of Birth: May 28, 1946

## 2021-02-20 ENCOUNTER — Ambulatory Visit: Payer: Medicare Other

## 2021-02-20 ENCOUNTER — Other Ambulatory Visit: Payer: Self-pay

## 2021-02-20 DIAGNOSIS — M6281 Muscle weakness (generalized): Secondary | ICD-10-CM | POA: Diagnosis not present

## 2021-02-20 DIAGNOSIS — R2681 Unsteadiness on feet: Secondary | ICD-10-CM

## 2021-02-20 NOTE — Therapy (Signed)
[facility] [address] [place], [place], [address] Phone: [phone]   Fax:  [phone]  Physical Therapy Treatment  Patient Details  Name: Curtis Carroll MRN: [id number] Date of Birth: 08-Dec-1945 Referring Provider (PT): Dr. 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   Chronic ankle pain    Colon polyp    Enthesopathy of ankle/tarsus    Family history of brain cancer    Family history of cancer    Family history of lung cancer    History of colon polyps    Hyperlipidemia    Hyperlipidemia    Hypertension    Polio     Past Surgical History:  Procedure Laterality Date   ANKLE ARTHROCENTESIS Right    COLONOSCOPY WITH PROPOFOL N/A 03/25/2017   Procedure: COLONOSCOPY WITH PROPOFOL;  Surgeon: Lollie Sails, MD;  Location: [facility] ENDOSCOPY;  Service: Endoscopy;  Laterality: N/A;   COLONOSCOPY WITH PROPOFOL N/A 01/01/2019   Procedure: COLONOSCOPY WITH PROPOFOL;  Surgeon: Lollie Sails, MD;  Location: [facility] ENDOSCOPY;  Service: Endoscopy;  Laterality: N/A;   ESOPHAGOGASTRODUODENOSCOPY (EGD) WITH PROPOFOL N/A 11/06/2019   Procedure: ESOPHAGOGASTRODUODENOSCOPY (EGD) WITH PROPOFOL;  Surgeon: Robert Bellow, MD;  Location: [facility] ENDOSCOPY;  Service: Endoscopy;  Laterality: N/A;   KNEE ARTHROSCOPY Left    pins in toes Right    SHOULDER ARTHROSCOPY Right    TOTAL KNEE ARTHROPLASTY Left  09/15/2015   Procedure: TOTAL KNEE ARTHROPLASTY;  Surgeon: Hessie Knows, MD;  Location: [facility] ORS;  Service: Orthopedics;  Laterality: Left;    There were no vitals filed for this visit.   Subjective Assessment - 02/20/21 1053     Subjective Pt reports he is doing well today. He reports continued improvement in lower extremity strength. No pain reported upon arrival but states that he is having some stiffness upon waking today.  No specific questions or concerns.    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 Arrives complaining of bilateral LE weakness which has been worsening over the last 6 months, worse in RLE. He has significant RLE atrophy and RLE is 3/4" shorter than the LLE. Pt wears extra insoles to account for the length difference. He had a fall 6 months ago with a L shoulder injury (RTC tear). He has noticed more difficulty transferring from sit to stand with limited ability to push off with LUE secondary to injury. Pt reports R ankle achilles lengthening and arthrodesis at 75 years old. He has pain in R foot/ankle if he tries to walk on hardwood floor without shoes. 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 He has had 2 falls in the last 6 months (last one was a few days ago).  Limitations Standing;Other (comment)    Diagnostic tests None reported                 TREATMENT   Ther-ex  NuStep L2 x 5 minutes BLE only for warm-up during interval history (5 minutes unbilled); Forward 6 inch step ups without upper extremity support when leading with left lower extremity and minimal extremity support leading with right lower extremity x10 BLE; Lateral 6 inch step ups with upper extremity support x 10 BLE; Sit to stand without UE support for speed 2 x 10; Resisted side stepping with blue tband around ankles x 1 minute; Mini squats 2 x 10 with heels on Airex pad;  Standing exercises with 5# ankle weights (AW): Hip flexion marching x 1 minute; HS curls x 1  minute; Hip abduction x 1 minute; Hip extension x 1 minute;  Seated LAQ with 5# AW x 1 minute; Seated clams with blue tband resistance x1 minute;   Neuromuscular Re-education  1/2 bolster A/P static balance x 30s; 1/2 bolser A/P ant/post weight shifts; 1/2 bolster tandem balance alternating forward LE x 30s each; Airex feet together eyes closed x 30s; Airex feet together with ball bounce off the wall with catch x multiple bouts;   Pt educated throughout session about proper posture and technique with exercises. Improved exercise technique, movement at target joints, use of target muscles after min to mod verbal, visual, tactile cues.    Patient demonstrates excellent motivation during session today.  He is able to repeat sit to stand exercise today for speed. Continued with LE strengthening as well as balance exercises. He is demonstrating improved RLE strength during step-ups forward and R lateral. Challenged balance again today on unstable surface. Patient encouraged to follow-up scheduled.  He will benefit from PT services to address deficits in strength, balance, and mobility in order to return to full function at home.                   PT Short Term Goals - 01/25/21 1442       PT SHORT TERM GOAL #1   Title Pt will be independent with HEP in order to improve strength and balance in order to decrease fall risk and improve function at home and with leisure activities.    Time 6    Period Weeks    Status New    Target Date 03/08/21               PT Long Term Goals - 01/25/21 1444       PT LONG TERM GOAL #1   Title Pt will increase FOTO to at least 62 in order to demonstrate significant improvement in function related to LE weakness and imbalance.    Baseline 01/25/21: 61    Time 12    Period Weeks    Status New    Target Date 04/19/21      PT LONG TERM GOAL #2   Title Pt will decrease 5TSTS by at least 3 seconds in order to demonstrate clinically  significant improvement in LE strength.    Baseline 01/25/21: 19.4s    Time 12    Period Weeks    Status New    Target Date 04/19/21      PT LONG TERM GOAL #3   Title Pt will improve BERG by at least 3 points in order to demonstrate clinically significant improvement in balance.    Baseline 01/25/21: 49    Time 12  Period Weeks    Status New    Target Date 04/19/21                   Plan - 02/20/21 1054     Clinical Impression Statement Patient demonstrates excellent motivation during session today.  He is able to repeat sit to stand exercise today for speed. Continued with LE strengthening as well as balance exercises. He is demonstrating improved RLE strength during step-ups forward and R lateral. Challenged balance again today on unstable surface. Patient encouraged to follow-up scheduled.  He will benefit from PT services to address deficits in strength, balance, and mobility in order to return to full function at home.    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    PT Frequency 2x / week    PT Duration 12 weeks    PT Treatment/Interventions ADLs/Self Care Home Management;Aquatic Therapy;Biofeedback;Canalith Repostioning;Iontophoresis 4mg /ml Dexamethasone;Cryotherapy;Electrical Stimulation;Moist Heat;Traction;Ultrasound;DME Instruction;Gait training;Stair training;Functional mobility training;Therapeutic activities;Therapeutic exercise;Balance training;Neuromuscular re-education;Patient/family education;Manual techniques;Passive range of motion;Dry needling;Vestibular;Spinal Manipulations;Joint  Manipulations    PT Next Visit Plan strengthening/balance exercises    PT Home Exercise Plan Access Code: [email or web address]    Consulted and Agree with Plan of Care Patient              Patient will benefit from skilled therapeutic intervention in order to improve the following deficits and impairments:  Abnormal gait, Decreased range of motion, Decreased strength, Decreased balance  Visit Diagnosis: Muscle weakness (generalized)  Unsteadiness on feet     Problem List Patient Active Problem List   Diagnosis Date Noted   History of colon polyps    Family history of brain cancer    Family history of cancer    Family history of lung cancer    Primary osteoarthritis of left knee 09/15/2015   Phillips Grout PT, DPT, GCS  Ashima Shrake, PT 02/20/2021, 1:13 PM  [facility] [address] [place], [place], [address] Phone: [phone]   Fax:  [phone]  Name: Curtis Carroll MRN: [id number] Date of Birth: 1945/11/01

## 2021-02-22 ENCOUNTER — Other Ambulatory Visit: Payer: Self-pay

## 2021-02-22 ENCOUNTER — Ambulatory Visit: Payer: Medicare Other

## 2021-02-22 DIAGNOSIS — M6281 Muscle weakness (generalized): Secondary | ICD-10-CM

## 2021-02-22 DIAGNOSIS — R2681 Unsteadiness on feet: Secondary | ICD-10-CM

## 2021-02-22 NOTE — Therapy (Signed)
[facility] [address] [place], [place], [address] Phone: [phone]   Fax:  [phone]  Physical Therapy Treatment  Patient Details  Name: Curtis Carroll MRN: [id number] Date of Birth: 1946/04/15 Referring Provider (PT): Dr. 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   Chronic ankle pain    Colon polyp    Enthesopathy of ankle/tarsus    Family history of brain cancer    Family history of cancer    Family history of lung cancer    History of colon polyps    Hyperlipidemia    Hyperlipidemia    Hypertension    Polio     Past Surgical History:  Procedure Laterality Date   ANKLE ARTHROCENTESIS Right    COLONOSCOPY WITH PROPOFOL N/A 03/25/2017   Procedure: COLONOSCOPY WITH PROPOFOL;  Surgeon: Lollie Sails, MD;  Location: [facility] ENDOSCOPY;  Service: Endoscopy;  Laterality: N/A;   COLONOSCOPY WITH PROPOFOL N/A 01/01/2019   Procedure: COLONOSCOPY WITH PROPOFOL;  Surgeon: Lollie Sails, MD;  Location: [facility] ENDOSCOPY;  Service: Endoscopy;  Laterality: N/A;   ESOPHAGOGASTRODUODENOSCOPY (EGD) WITH PROPOFOL N/A 11/06/2019   Procedure: ESOPHAGOGASTRODUODENOSCOPY (EGD) WITH PROPOFOL;  Surgeon: Robert Bellow, MD;  Location: [facility] ENDOSCOPY;  Service: Endoscopy;  Laterality: N/A;   KNEE ARTHROSCOPY Left    pins in toes Right    SHOULDER ARTHROSCOPY Right    TOTAL KNEE ARTHROPLASTY  Left 09/15/2015   Procedure: TOTAL KNEE ARTHROPLASTY;  Surgeon: Hessie Knows, MD;  Location: [facility] ORS;  Service: Orthopedics;  Laterality: Left;    There were no vitals filed for this visit.   Subjective Assessment - 02/22/21 1337     Subjective Pt reports he is doing well today. He reports continued improvement in lower extremity strength. No pain reported upon arrival today and no significant changes since last therapy session.  No specific questions or concerns.    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 Arrives complaining of bilateral LE weakness which has been worsening over the last 6 months, worse in RLE. He has significant RLE atrophy and RLE is 3/4" shorter than the LLE. Pt wears extra insoles to account for the length difference. He had a fall 6 months ago with a L shoulder injury (RTC tear). He has noticed more difficulty transferring from sit to stand with limited ability to push off with LUE secondary to injury. Pt reports R ankle achilles lengthening and arthrodesis at 75 years old. He has pain in R foot/ankle if he tries to walk on hardwood floor without shoes. 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 He has had 2 falls in the last 6 months (last one was a few days ago).  Limitations Standing;Other (comment)    Diagnostic tests None reported                  TREATMENT   Ther-ex  Sit to stand without UE support for speed 2 x 10; NuStep L2-6 x 5 minutes BLE only at end of session during HEP update x 5 minutes;   Neuromuscular Re-education  Airex tandem balance alternating forward LE 2 x 30s each; Airex balance beam tandem forward gait x 6 lengths; Airex balance beam side stepping x 6 lengths; 1/2 bolster A/P static balance x 30s; 1/2 bolser A/P ant/post weight shifts; 1/2 bolster tandem balance alternating forward LE x 30s each; Airex alternating 6" step taps x 10 BLE; Airex feet together eyes closed x 30s; Airex feet together with  horizontal and vertical head turns x 30s each; Airex feet together with horizontal ball passes around body; Forward/backward steps over 12" hurdle on Airex pads; Lateral steps over 12" hurdle on Airex pads;   Pt educated throughout session about proper posture and technique with exercises. Improved exercise technique, movement at target joints, use of target muscles after min to mod verbal, visual, tactile cues.    Patient demonstrates excellent motivation during session today.  He is able to repeat sit to stand exercise today for speed. Session today focused mostly on balance exercises. He continues to struggle with balance on Airex pad in tandem position as well as with dynamic tasks such as head turns. He also struggles with R single leg stance on Airex pad during stepping activities. HEP updated to include additional balance exercises to perform at home. Patient encouraged to follow-up scheduled.  He will benefit from PT services to address deficits in strength, balance, and mobility in order to return to full function at home.                 PT Education - 02/22/21 1337     Education Details HEP    Person(s) Educated Patient    Methods Explanation    Comprehension Verbalized understanding              PT Short Term Goals - 01/25/21 1442       PT SHORT TERM GOAL #1   Title Pt will be independent with HEP in order to improve strength and balance in order to decrease fall risk and improve function at home and with leisure activities.    Time 6    Period Weeks    Status New    Target Date 03/08/21               PT Long Term Goals - 01/25/21 1444       PT LONG TERM GOAL #1   Title Pt will increase FOTO to at least 62 in order to demonstrate significant improvement in function related to LE weakness and imbalance.    Baseline 01/25/21: 61    Time 12    Period Weeks    Status New    Target Date 04/19/21      PT LONG TERM GOAL #2   Title Pt will decrease  5TSTS by at least 3 seconds in order to demonstrate clinically significant improvement in LE strength.    Baseline 01/25/21: 19.4s    Time 12    Period Weeks    Status New    Target Date 04/19/21      PT LONG TERM GOAL #3   Title Pt will improve BERG by at least 3 points  in order to demonstrate clinically significant improvement in balance.    Baseline 01/25/21: 49    Time 12    Period Weeks    Status New    Target Date 04/19/21                   Plan - 02/22/21 1338     Clinical Impression Statement Patient demonstrates excellent motivation during session today.  He is able to repeat sit to stand exercise today for speed. Session today focused mostly on balance exercises. He continues to struggle with balance on Airex pad in tandem position as well as with dynamic tasks such as head turns. He also struggles with R single leg stance on Airex pad during stepping activities. HEP updated to include additional balance exercises to perform at home. Patient encouraged to follow-up scheduled.  He will benefit from PT services to address deficits in strength, balance, and mobility in order to return to full function at home.    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    PT Frequency 2x / week    PT Duration 12 weeks    PT Treatment/Interventions ADLs/Self Care Home Management;Aquatic Therapy;Biofeedback;Canalith Repostioning;Iontophoresis 4mg /ml Dexamethasone;Cryotherapy;Electrical Stimulation;Moist Heat;Traction;Ultrasound;DME Instruction;Gait training;Stair training;Functional mobility training;Therapeutic  activities;Therapeutic exercise;Balance training;Neuromuscular re-education;Patient/family education;Manual techniques;Passive range of motion;Dry needling;Vestibular;Spinal Manipulations;Joint Manipulations    PT Next Visit Plan strengthening/balance exercises    PT Home Exercise Plan Access Code: [email or web address]    Consulted and Agree with Plan of Care Patient               Patient will benefit from skilled therapeutic intervention in order to improve the following deficits and impairments:  Abnormal gait, Decreased range of motion, Decreased strength, Decreased balance  Visit Diagnosis: Muscle weakness (generalized)  Unsteadiness on feet     Problem List Patient Active Problem List   Diagnosis Date Noted   History of colon polyps    Family history of brain cancer    Family history of cancer    Family history of lung cancer    Primary osteoarthritis of left knee 09/15/2015   Phillips Grout PT, DPT, GCS  Nellie Pester, PT 02/22/2021, 1:43 PM  [facility] [address]. [place], [place], [address] Phone: [phone]   Fax:  [phone]  Name: TURRELL SEVERT MRN: [id number] Date of Birth: [birth year]

## 2021-02-22 NOTE — Patient Instructions (Signed)
Access Code: [email or web address] URL: [email or web address] Date: 02/22/2021 Prepared by: Roxana Hires  Exercises Sit to Stand with Arms Crossed - 1 x daily - 7 x weekly - 2 sets - 10 reps Mini Squat with Counter Support - 1 x daily - 7 x weekly - 2 sets - 10 reps Seated Hip Abduction with Resistance - 1 x daily - 7 x weekly - 2 sets - 10 reps - 3s hold Seated Hip Adduction Squeeze with Ball - 1 x daily - 7 x weekly - 2 sets - 10 reps - 3s hold Standing Tandem Balance with Counter Support - 1 x daily - 7 x weekly - 2 x 30s with each foot forward hold Romberg Stance Eyes Closed on Foam Pad - 1 x daily - 7 x weekly - 3 x 30s hold Romberg Stance on Foam Pad with Head Rotation - 1 x daily - 7 x weekly - 3 x 30s hold

## 2021-02-27 ENCOUNTER — Other Ambulatory Visit: Payer: Self-pay

## 2021-02-27 ENCOUNTER — Ambulatory Visit: Payer: Medicare Other | Attending: Endocrinology

## 2021-02-27 DIAGNOSIS — R2681 Unsteadiness on feet: Secondary | ICD-10-CM | POA: Diagnosis present

## 2021-02-27 DIAGNOSIS — M6281 Muscle weakness (generalized): Secondary | ICD-10-CM

## 2021-02-27 DIAGNOSIS — Z9181 History of falling: Secondary | ICD-10-CM

## 2021-02-27 NOTE — Therapy (Signed)
[facility] [address] [place], [place], [address] Phone: [phone]   Fax:  [phone]  Physical Therapy Treatment  Patient Details  Name: Curtis Carroll MRN: [id number] Date of Birth: [birth year] Referring Provider (PT): Dr. 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   Chronic ankle pain    Colon polyp    Enthesopathy of ankle/tarsus    Family history of brain cancer    Family history of cancer    Family history of lung cancer    History of colon polyps    Hyperlipidemia    Hyperlipidemia    Hypertension    Polio     Past Surgical History:  Procedure Laterality Date   ANKLE ARTHROCENTESIS Right    COLONOSCOPY WITH PROPOFOL N/A 03/25/2017   Procedure: COLONOSCOPY WITH PROPOFOL;  Surgeon: Lollie Sails, MD;  Location: [facility] ENDOSCOPY;  Service: Endoscopy;  Laterality: N/A;   COLONOSCOPY WITH PROPOFOL N/A 01/01/2019   Procedure: COLONOSCOPY WITH PROPOFOL;  Surgeon: Lollie Sails, MD;  Location: [facility] ENDOSCOPY;  Service: Endoscopy;  Laterality: N/A;   ESOPHAGOGASTRODUODENOSCOPY (EGD) WITH PROPOFOL N/A 11/06/2019   Procedure: ESOPHAGOGASTRODUODENOSCOPY (EGD) WITH PROPOFOL;  Surgeon: Robert Bellow, MD;  Location: [facility] ENDOSCOPY;  Service: Endoscopy;  Laterality: N/A;   KNEE ARTHROSCOPY Left    pins in toes Right    SHOULDER ARTHROSCOPY Right    TOTAL KNEE ARTHROPLASTY Left  09/15/2015   Procedure: TOTAL KNEE ARTHROPLASTY;  Surgeon: Hessie Knows, MD;  Location: [facility] ORS;  Service: Orthopedics;  Laterality: Left;    There were no vitals filed for this visit.   Subjective Assessment - 02/27/21 1104     Subjective Pt reports doing well. Still believes strength and balance is improving. No new concerns or udpates and denies falls.    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 Arrives complaining of bilateral LE weakness which has been worsening over the last 6 months, worse in RLE. He has significant RLE atrophy and RLE is 3/4" shorter than the LLE. Pt wears extra insoles to account for the length difference. He had a fall 6 months ago with a L shoulder injury (RTC tear). He has noticed more difficulty transferring from sit to stand with limited ability to push off with LUE secondary to injury. Pt reports R ankle achilles lengthening and arthrodesis at 75 years old. He has pain in R foot/ankle if he tries to walk on hardwood floor without shoes. 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 He has had 2 falls in the last 6 months (last one was a few days ago).    Limitations Standing;Other (comment)    Diagnostic tests None reported  There.ex:   NuStep L2-3 x 5 minutes BLE only at beginning of session. Not billed.   STS with airex pad in seat (19" total): 3x10. SBA provided.   RLE LAQ with 4# AW: 3x12, with emphasis on eccentric lowering  RLE calf stretch: 1x45 sec. VC's to optimize form/technique and stretch due to history or R ankle fusion (limited ankle DF).   Neuro Re-Ed: CGA to SBA for balance exercises throughout  Airex balance beam tandem forward gait x 6 lengths Airex balance beam side stepping x 6 lengths  Airex beam alternating 6" steps: 1x10/LE with 2# AW then 1x10 with 4# AW  Airex pad FT/EC: 1x30 sec, 1x45 sec, 1x1 min  Airex pad tandem stance with horizontal head turns: 4x30 sec total. X2/LE under BOS. Difficulty with RLE >  LLE under BOS. Cuing for reducing Anterolateral to L weight shift as compensation onto stronger LE. Intermittent need for SUE support   Ambulating over unstable surface (blue pad with x4 AW's underneath): x6 lengths, requiring intermittent stepping strategy to regain balance.     PT Education - 02/27/21 1105     Education Details form/technique with exercise.    Person(s) Educated Patient    Methods Explanation;Demonstration;Tactile cues;Verbal cues    Comprehension Verbalized understanding;Returned demonstration              PT Short Term Goals - 01/25/21 1442       PT SHORT TERM GOAL #1   Title Pt will be independent with HEP in order to improve strength and balance in order to decrease fall risk and improve function at home and with leisure activities.    Time 6    Period Weeks    Status New    Target Date 03/08/21               PT Long Term Goals - 01/25/21 1444       PT LONG TERM GOAL #1   Title Pt will increase FOTO to at least 62 in order to demonstrate significant improvement in function related to LE weakness and imbalance.    Baseline 01/25/21: 61    Time 12    Period Weeks    Status New    Target Date 04/19/21      PT LONG TERM GOAL #2   Title Pt will decrease 5TSTS by at least 3 seconds in order to demonstrate clinically significant improvement in LE strength.    Baseline 01/25/21: 19.4s    Time 12    Period Weeks    Status New    Target Date 04/19/21      PT LONG TERM GOAL #3   Title Pt will improve BERG by at least 3 points in order to demonstrate clinically significant improvement in balance.    Baseline 01/25/21: 49    Time 12    Period Weeks    Status New    Target Date 04/19/21                   Plan - 02/27/21 1157     Clinical Impression Statement Covering PT continuing primary PT POC with LE strength and balance. Pt displaying most difficulty with tandem balance on unstable surfaces and ambulating over unstable surfaces  mimicking at home yard. Pt requires intermittent UE support throughout to prevent lateral sway and intermittent stepping strategy to correct LOB with ambulating over unstable surfaces. Pt will continue to benefit from skilled PT services to progress dynamic balance and LE strength  to optimize functional mobility.    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    PT Frequency 2x / week    PT Duration 12 weeks    PT Treatment/Interventions ADLs/Self Care Home Management;Aquatic Therapy;Biofeedback;Canalith Repostioning;Iontophoresis 4mg /ml Dexamethasone;Cryotherapy;Electrical Stimulation;Moist Heat;Traction;Ultrasound;DME Instruction;Gait training;Stair training;Functional mobility training;Therapeutic activities;Therapeutic exercise;Balance training;Neuromuscular re-education;Patient/family education;Manual techniques;Passive range of motion;Dry needling;Vestibular;Spinal Manipulations;Joint Manipulations    PT Next Visit Plan dynamic hip flexion strengthening/gait    PT Home Exercise Plan Access Code: [email or web address]    Consulted and Agree with Plan of Care Patient             Patient will benefit from skilled therapeutic intervention in order to improve the following deficits and impairments:  Abnormal gait, Decreased range of motion, Decreased strength, Decreased balance  Visit Diagnosis: Muscle weakness (generalized)  Unsteadiness on feet  History of falling     Problem List Patient Active Problem List   Diagnosis Date Noted   History of colon polyps    Family history of brain cancer    Family history of  cancer    Family history of lung cancer    Primary osteoarthritis of left knee 09/15/2015    Salem Caster. Fairly IV, PT, DPT Physical Therapist- [facility]  02/27/2021, 12:07 PM  [facility] [address]. [place], [place], [address] Phone: [phone]   Fax:  [phone]  Name: BAILY SERPE MRN: [id number] Date of Birth: 07/06/1945

## 2021-03-01 ENCOUNTER — Ambulatory Visit: Payer: Medicare Other

## 2021-03-01 ENCOUNTER — Other Ambulatory Visit: Payer: Self-pay

## 2021-03-01 DIAGNOSIS — Z9181 History of falling: Secondary | ICD-10-CM

## 2021-03-01 DIAGNOSIS — R2681 Unsteadiness on feet: Secondary | ICD-10-CM

## 2021-03-01 DIAGNOSIS — M6281 Muscle weakness (generalized): Secondary | ICD-10-CM

## 2021-03-01 NOTE — Therapy (Signed)
[facility] [address]. [place], [place], [address] Phone: [phone]   Fax:  [phone]  Physical Therapy Treatment/Physical Therapy Progress Note   Dates of reporting period  01/25/2021  to  03/01/2021  Patient Details  Name: Curtis Carroll MRN: [id number] Date of Birth: November 01, 1945 Referring Provider (PT): Dr. 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   Chronic ankle pain    Colon polyp    Enthesopathy of ankle/tarsus    Family history of brain cancer    Family history of cancer    Family history of lung cancer    History of colon polyps    Hyperlipidemia    Hyperlipidemia    Hypertension    Polio     Past Surgical History:  Procedure Laterality Date   ANKLE ARTHROCENTESIS Right    COLONOSCOPY WITH PROPOFOL N/A 03/25/2017   Procedure: COLONOSCOPY WITH PROPOFOL;  Surgeon: Lollie Sails, MD;  Location: [facility] ENDOSCOPY;  Service: Endoscopy;  Laterality: N/A;   COLONOSCOPY WITH PROPOFOL N/A 01/01/2019   Procedure: COLONOSCOPY WITH PROPOFOL;  Surgeon: Lollie Sails, MD;  Location: [facility] ENDOSCOPY;  Service: Endoscopy;  Laterality: N/A;   ESOPHAGOGASTRODUODENOSCOPY (EGD) WITH PROPOFOL N/A 11/06/2019   Procedure: ESOPHAGOGASTRODUODENOSCOPY (EGD) WITH PROPOFOL;  Surgeon: Robert Bellow, MD;  Location: [facility] ENDOSCOPY;  Service: Endoscopy;  Laterality: N/A;   KNEE ARTHROSCOPY Left     pins in toes Right    SHOULDER ARTHROSCOPY Right    TOTAL KNEE ARTHROPLASTY Left 09/15/2015   Procedure: TOTAL KNEE ARTHROPLASTY;  Surgeon: Hessie Knows, MD;  Location: [facility] ORS;  Service: Orthopedics;  Laterality: Left;    There were no vitals filed for this visit.   Subjective Assessment - 03/01/21 1058     Subjective Pt reports stiffness in low back but no pain. No LE soreness from previous session.    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 Arrives complaining of bilateral LE weakness which has been worsening over the last 6 months, worse in RLE. He has significant RLE atrophy and RLE is 3/4" shorter than the LLE. Pt wears extra insoles to account for the length difference. He had a fall 6 months ago with a L shoulder injury (RTC tear). He has noticed more difficulty transferring from sit to stand with limited ability to push off with LUE secondary to injury. Pt reports R ankle achilles lengthening and arthrodesis at 75 years old. He has pain in R foot/ankle if he tries to walk on hardwood floor without shoes. 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 He has had 2 falls in the last 6 months (last one was a few days ago).    Limitations Standing;Other (comment)    Diagnostic  tests None reported    Currently in Pain? No/denies                [facility] PT Assessment - 03/01/21 1109       Berg Balance Test   Sit to Stand Able to stand without using hands and stabilize independently    Standing Unsupported Able to stand safely 2 minutes    Sitting with Back Unsupported but Feet Supported on Floor or Stool Able to sit safely and securely 2 minutes    Stand to Sit Sits safely with minimal use of hands    Transfers Able to transfer safely, minor use of hands    Standing Unsupported with Eyes Closed Able to stand 10 seconds safely    Standing Unsupported with Feet Together Able to place feet together independently and stand for 1 minute with supervision    From Standing,  Reach Forward with Outstretched Arm Can reach confidently >25 cm (10")    From Standing Position, Pick up Object from Floor Able to pick up shoe safely and easily    From Standing Position, Turn to Look Behind Over each Shoulder Looks behind from both sides and weight shifts well    Turn 360 Degrees Able to turn 360 degrees safely in 4 seconds or less    Standing Unsupported, Alternately Place Feet on Step/Stool Able to stand independently and safely and complete 8 steps in 20 seconds    Standing Unsupported, One Foot in Front Able to plae foot ahead of the other independently and hold 30 seconds    Standing on One Leg Able to lift leg independently and hold equal to or more than 3 seconds    Total Score 52            Physical Performance:   Reassessment of goals:    FOTO: 57 with target goal of 62   Berg: 52/56   5xSTS: 12.3 sec with airex pad in seat (19")    There.ex:   Nu-Step L2 for 5 min for LE warm up: Not billed.   Forward 6" step up on BLE's with BUE support: difficulty with RLE requiring significant UE support. 1x10/LE   Forward and side step ups with 3" step: 1x10/LE per direction   Squats with use of mirror as visual cue to improve weight shift onto RLE. Use of UE support, use of chair as tactile cue for squat depth. 2x12     Patient's condition has the potential to improve in response to therapy. Maximum improvement is yet to be obtained. The anticipated improvement is attainable and reasonable in a generally predictable time.    PT Education - 03/01/21 1059     Education Details form/technique with exercise.    Person(s) Educated Patient    Methods Explanation;Demonstration    Comprehension Verbalized understanding              PT Short Term Goals - 03/01/21 1100       PT SHORT TERM GOAL #1   Title Pt will be independent with HEP in order to improve strength and balance in order to decrease fall risk and improve function at home and with leisure  activities.    Baseline 10/5: reports completing 2x/day    Time 6    Period Weeks    Status Achieved    Target Date 03/01/21               PT Long Term Goals - 03/01/21 1104  PT LONG TERM GOAL #1   Title Pt will increase FOTO to at least 62 in order to demonstrate significant improvement in function related to LE weakness and imbalance.    Baseline 01/25/21: 61; 03/01/2021: 57    Time 12    Period Weeks    Status On-going    Target Date 04/19/21      PT LONG TERM GOAL #2   Title Pt will decrease 5TSTS by at least 3 seconds in order to demonstrate clinically significant improvement in LE strength.    Baseline 01/25/21: 19.4s; 03/01/21: 12.3 sec (19" surface per pt report?- small airex pad in seat    Time 12    Period Weeks    Status Partially Met    Target Date 04/19/21      PT LONG TERM GOAL #3   Title Pt will improve BERG by at least 3 points in order to demonstrate clinically significant improvement in balance.    Baseline 01/25/21: 49; 03/01/2021; 03/01/2021    Time 12    Period Weeks    Status Achieved    Target Date 03/01/21                   Plan - 03/01/21 1148     Clinical Impression Statement Pt making progress towards goals in POC. pt accomplishing short term goal and meeting Berg balance goal scoring a 52/56 and improvement in LE strength via 5xSTS scoring 12.3 sec from 19" surface. Pt did regress in FOTO score. Overall pt pleased with progress and displays improvements in balance and strength thus reducing pt's risk of falls at home and community. 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    PT Frequency 2x / week    PT Duration 12 weeks    PT Treatment/Interventions ADLs/Self Care Home Management;Aquatic Therapy;Biofeedback;Canalith Repostioning;Iontophoresis 72m/ml Dexamethasone;Cryotherapy;Electrical Stimulation;Moist Heat;Traction;Ultrasound;DME Instruction;Gait training;Stair training;Functional mobility training;Therapeutic activities;Therapeutic exercise;Balance training;Neuromuscular re-education;Patient/family education;Manual techniques;Passive range of motion;Dry needling;Vestibular;Spinal Manipulations;Joint Manipulations    PT Next Visit Plan dynamic hip flexion strengthening/gait    PT Home Exercise Plan Access Code: [email or web address]    Consulted and Agree with Plan of Care Patient             Patient will benefit from skilled therapeutic intervention in order to improve the following deficits and impairments:  Abnormal gait, Decreased range of motion, Decreased strength, Decreased balance  Visit Diagnosis: Muscle weakness (generalized)  Unsteadiness on feet  History of falling     Problem List Patient Active Problem List   Diagnosis Date Noted   History of colon polyps    Family history of brain cancer    Family history of cancer    Family history of lung cancer    Primary osteoarthritis of left knee 09/15/2015    MSalem Caster Fairly IV, PT, DPT Physical Therapist- [facility] 03/01/2021, 11:56 AM  [facility] [place] [address]  Phone: [phone]   Fax:  [phone]  Name: Curtis Carroll MRN: [id number] Date of Birth: 08-05-1945

## 2021-03-06 ENCOUNTER — Other Ambulatory Visit: Payer: Self-pay

## 2021-03-06 ENCOUNTER — Ambulatory Visit: Payer: Medicare Other

## 2021-03-06 DIAGNOSIS — M6281 Muscle weakness (generalized): Secondary | ICD-10-CM

## 2021-03-06 DIAGNOSIS — Z9181 History of falling: Secondary | ICD-10-CM

## 2021-03-06 DIAGNOSIS — R2681 Unsteadiness on feet: Secondary | ICD-10-CM

## 2021-03-06 NOTE — Therapy (Signed)
[facility] [address] [place], [place], [address] Phone: [phone]   Fax:  [phone]  Physical Therapy Treatment  Patient Details  Name: Curtis Carroll MRN: [id number] Date of Birth: [birth year] Referring Provider (PT): Dr. 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   Chronic ankle pain    Colon polyp    Enthesopathy of ankle/tarsus    Family history of brain cancer    Family history of cancer    Family history of lung cancer    History of colon polyps    Hyperlipidemia    Hyperlipidemia    Hypertension    Polio     Past Surgical History:  Procedure Laterality Date   ANKLE ARTHROCENTESIS Right    COLONOSCOPY WITH PROPOFOL N/A 03/25/2017   Procedure: COLONOSCOPY WITH PROPOFOL;  Surgeon: Lollie Sails, MD;  Location: [facility] ENDOSCOPY;  Service: Endoscopy;  Laterality: N/A;   COLONOSCOPY WITH PROPOFOL N/A 01/01/2019   Procedure: COLONOSCOPY WITH PROPOFOL;  Surgeon: Lollie Sails, MD;  Location: [facility] ENDOSCOPY;  Service: Endoscopy;  Laterality: N/A;   ESOPHAGOGASTRODUODENOSCOPY (EGD) WITH PROPOFOL N/A 11/06/2019   Procedure: ESOPHAGOGASTRODUODENOSCOPY (EGD) WITH PROPOFOL;  Surgeon: Robert Bellow, MD;  Location: [facility] ENDOSCOPY;  Service: Endoscopy;  Laterality: N/A;   KNEE ARTHROSCOPY Left    pins in toes Right    SHOULDER ARTHROSCOPY Right    TOTAL KNEE ARTHROPLASTY  Left 09/15/2015   Procedure: TOTAL KNEE ARTHROPLASTY;  Surgeon: Hessie Knows, MD;  Location: [facility] ORS;  Service: Orthopedics;  Laterality: Left;    There were no vitals filed for this visit.   Subjective Assessment - 03/06/21 1056     Subjective Pt states that he is doing alright today. He did fall last night while he was doing his balance exercises. He was moving from his office into his living room with 1# ankle weights and he tried to do some high knee marches when his RLE buckled and he fell to the right. He landed on his R wrist which is hurting today. He denies any bruising or swelling in his R wrist but states the pain is 1-2/10 upon arrival.    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 Arrives complaining of bilateral LE weakness which has been worsening over the last 6 months, worse in RLE. He has significant RLE atrophy and RLE is 3/4" shorter than the LLE. Pt wears extra insoles to account for the length difference. He had a fall 6 months ago with a L shoulder injury (RTC tear). He has noticed more difficulty transferring from sit to stand with limited ability to push off with LUE secondary to injury. Pt reports R ankle achilles lengthening and arthrodesis at 75 years old. He  has pain in R foot/ankle if he tries to walk on hardwood floor without shoes. 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 He has had 2 falls in the last 6 months (last one was a few days ago).    Limitations Standing;Other (comment)    Diagnostic tests None reported                TREATMENT     Ther-ex  NuStep L2-6 x 5 minutes BLE only at end of session during HEP update x 5 minutes; Sit to stand from regular height chair with Airex pad on seat holding 4# med ball x 10, 6# med ball x 10; Seated LAQ with 4# ankle weights (AW) 2 x 1 minute;  Standing exercises with 4# AW: Marches x 1 minute; Hip abduction x 1 minute; HS curls x 1 minute; Hip extension x 1 minute;    Neuromuscular  Re-education  Practiced floor to standing transfer from blue mat. Education about proper sequencing. Performed multiple bouts with and without UE support on chair; Practiced gait with single lofstrand crutch in LUE around rehab gym and discussed appropriate use of assistive devices; Blue mat tandem balance alternating forward LE x 30s each; Blue mat semitandem balance alternating forward LE with cone passes around body x multiple bouts to each side; Semitandem balance on blue mat with eyes closed alternating forward LE x 30s each;     Pt educated throughout session about proper posture and technique with exercises. Improved exercise technique, movement at target joints, use of target muscles after min to mod verbal, visual, tactile cues.      Patient demonstrates excellent motivation during session today.  He is able to repeat sit to stand exercise today and added medicine ball holds to increase challenge. Pt reports that he struggles to get up from floor so practiced floor to stand transfers with patient today. He presents with some unique challenges due to limited L knee flexion and R ankle ranges of motion. Pt is progressing toward discharge to continue his HEP independently. Patient encouraged to follow-up scheduled.  He will benefit from PT services to address deficits in strength, balance, and mobility in order to return to full function at home.                    PT Short Term Goals - 03/01/21 1100       PT SHORT TERM GOAL #1   Title Pt will be independent with HEP in order to improve strength and balance in order to decrease fall risk and improve function at home and with leisure activities.    Baseline 10/5: reports completing 2x/day    Time 6    Period Weeks    Status Achieved    Target Date 03/01/21               PT Long Term Goals - 03/01/21 1104       PT LONG TERM GOAL #1   Title Pt will increase FOTO to at least 62 in order to demonstrate significant  improvement in function related to LE weakness and imbalance.    Baseline 01/25/21: 61; 03/01/2021: 57    Time 12    Period Weeks    Status On-going    Target Date 04/19/21      PT LONG TERM GOAL #2   Title Pt will decrease 5TSTS by at least 3 seconds in order to demonstrate clinically significant improvement in LE strength.  Baseline 01/25/21: 19.4s; 03/01/21: 12.3 sec (19" surface per pt report?- small airex pad in seat    Time 12    Period Weeks    Status Partially Met    Target Date 04/19/21      PT LONG TERM GOAL #3   Title Pt will improve BERG by at least 3 points in order to demonstrate clinically significant improvement in balance.    Baseline 01/25/21: 49; 03/01/2021; 03/01/2021    Time 12    Period Weeks    Status Achieved    Target Date 03/01/21                   Plan - 03/06/21 1100     Clinical Impression Statement Patient demonstrates excellent motivation during session today.  He is able to repeat sit to stand exercise today and added medicine ball holds to increase challenge. Pt reports that he struggles to get up from floor so practiced floor to stand transfers with patient today. He presents with some unique challenges due to limited L knee flexion and R ankle ranges of motion. Pt is progressing toward discharge to continue his HEP independently. Patient encouraged to follow-up scheduled.  He will benefit from PT services to address deficits in strength, balance, and mobility in order to return to full function at home.    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    PT Frequency 2x /  week    PT Duration 12 weeks    PT Treatment/Interventions ADLs/Self Care Home Management;Aquatic Therapy;Biofeedback;Canalith Repostioning;Iontophoresis 38m/ml Dexamethasone;Cryotherapy;Electrical Stimulation;Moist Heat;Traction;Ultrasound;DME Instruction;Gait training;Stair training;Functional mobility training;Therapeutic activities;Therapeutic exercise;Balance training;Neuromuscular re-education;Patient/family education;Manual techniques;Passive range of motion;Dry needling;Vestibular;Spinal Manipulations;Joint Manipulations    PT Next Visit Plan dynamic hip flexion strengthening/gait    PT Home Exercise Plan Access Code: [email or web address]    Consulted and Agree with Plan of Care Patient               Patient will benefit from skilled therapeutic intervention in order to improve the following deficits and impairments:  Abnormal gait, Decreased range of motion, Decreased strength, Decreased balance  Visit Diagnosis: Muscle weakness (generalized)  Unsteadiness on feet  History of falling     Problem List Patient Active Problem List   Diagnosis Date Noted   History of colon polyps    Family history of brain cancer    Family history of cancer    Family history of lung cancer    Primary osteoarthritis of left knee 09/15/2015   JPhillips GroutPT, DPT, GCS  Keniyah Gelinas, PT 03/06/2021, 1:44 PM  [facility] [place] [address]Phone: [phone]  Fax:  [phone] Name: Curtis ABRAHAMRN: [id number] of Birth: [birth year]

## 2021-03-08 ENCOUNTER — Ambulatory Visit: Payer: Medicare Other

## 2021-03-08 ENCOUNTER — Other Ambulatory Visit: Payer: Self-pay

## 2021-03-08 DIAGNOSIS — R2681 Unsteadiness on feet: Secondary | ICD-10-CM

## 2021-03-08 DIAGNOSIS — M6281 Muscle weakness (generalized): Secondary | ICD-10-CM

## 2021-03-08 NOTE — Therapy (Signed)
[facility] [address] [place], [place], [address] Phone: [phone]   Fax:  [phone]  Physical Therapy Treatment  Patient Details  Name: Curtis Carroll MRN: [id number] Date of Birth: 1945/09/15 Referring Provider (PT): Dr. 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   Chronic ankle pain    Colon polyp    Enthesopathy of ankle/tarsus    Family history of brain cancer    Family history of cancer    Family history of lung cancer    History of colon polyps    Hyperlipidemia    Hyperlipidemia    Hypertension    Polio     Past Surgical History:  Procedure Laterality Date   ANKLE ARTHROCENTESIS Right    COLONOSCOPY WITH PROPOFOL N/A 03/25/2017   Procedure: COLONOSCOPY WITH PROPOFOL;  Surgeon: Lollie Sails, MD;  Location: [facility] ENDOSCOPY;  Service: Endoscopy;  Laterality: N/A;   COLONOSCOPY WITH PROPOFOL N/A 01/01/2019   Procedure: COLONOSCOPY WITH PROPOFOL;  Surgeon: Lollie Sails, MD;  Location: [facility] ENDOSCOPY;  Service: Endoscopy;  Laterality: N/A;   ESOPHAGOGASTRODUODENOSCOPY (EGD) WITH PROPOFOL N/A 11/06/2019   Procedure: ESOPHAGOGASTRODUODENOSCOPY (EGD) WITH PROPOFOL;  Surgeon: Robert Bellow, MD;  Location: [facility] ENDOSCOPY;  Service: Endoscopy;  Laterality: N/A;   KNEE ARTHROSCOPY Left    pins in toes Right    SHOULDER ARTHROSCOPY Right    TOTAL KNEE ARTHROPLASTY  Left 09/15/2015   Procedure: TOTAL KNEE ARTHROPLASTY;  Surgeon: Hessie Knows, MD;  Location: [facility] ORS;  Service: Orthopedics;  Laterality: Left;    There were no vitals filed for this visit.   Subjective Assessment - 03/08/21 1403     Subjective Pt states that he is doing alright today. No falls since last therapy session. No pain reported upon arrival and no specific questions/concerns currently.    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 Arrives complaining of bilateral LE weakness which has been worsening over the last 6 months, worse in RLE. He has significant RLE atrophy and RLE is 3/4" shorter than the LLE. Pt wears extra insoles to account for the length difference. He had a fall 6 months ago with a L shoulder injury (RTC tear). He has noticed more difficulty transferring from sit to stand with limited ability to push off with LUE secondary to injury. Pt reports R ankle achilles lengthening and arthrodesis at 75 years old. He has pain in R foot/ankle if he tries to walk on hardwood floor without shoes. 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 He has had 2 falls in the last 6 months (last one was a few days ago).    Limitations Standing;Other (comment)    Diagnostic tests None reported  TREATMENT     Ther-ex  NuStep L2-4 x 5 minutes BLE only; Supine SLR with 3# ankle weight (AW) RLE x 20, x 17 (muscle failure), LLE: 2 x 20; Hooklying bridges 3-5s hold x 10; Hooklying single leg bridge 3-5s hold x 10 BLE,  Hooklying clams with manual resistance from therapist 2 x 10; Hooklying adductor squeeze with manual resistance from therapist 2 x 10; Sidelying straight knee hip abduction with 3# AW 2 x 15 BLE; Sidelying reverse clams with 3# AW 2 x 10 BLE; Prone hamstring curls 3# AW x 20 BLE; Prone straight knee hip extension 3# AW x 10 BLE; Seated LAQ with 3# ankle weights (AW) x 1 minute;      Pt educated throughout session about proper posture  and technique with exercises. Improved exercise technique, movement at target joints, use of target muscles after min to mod verbal, visual, tactile cues.      Patient demonstrates excellent motivation during session today. Session today focused on isolated strengthening of bilateral lower extremities.  He continues to demonstrate fatigue with increased repetitions and resistance.  Will update outcome measures and goals the next session and plan for discharge.  Patient encouraged to follow-up scheduled.  He will benefit from PT services to address deficits in strength, balance, and mobility in order to return to full function at home.                    PT Short Term Goals - 03/01/21 1100       PT SHORT TERM GOAL #1   Title Pt will be independent with HEP in order to improve strength and balance in order to decrease fall risk and improve function at home and with leisure activities.    Baseline 10/5: reports completing 2x/day    Time 6    Period Weeks    Status Achieved    Target Date 03/01/21               PT Long Term Goals - 03/01/21 1104       PT LONG TERM GOAL #1   Title Pt will increase FOTO to at least 62 in order to demonstrate significant improvement in function related to LE weakness and imbalance.    Baseline 01/25/21: 61; 03/01/2021: 57    Time 12    Period Weeks    Status On-going    Target Date 04/19/21      PT LONG TERM GOAL #2   Title Pt will decrease 5TSTS by at least 3 seconds in order to demonstrate clinically significant improvement in LE strength.    Baseline 01/25/21: 19.4s; 03/01/21: 12.3 sec (19" surface per pt report?- small airex pad in seat    Time 12    Period Weeks    Status Partially Met    Target Date 04/19/21      PT LONG TERM GOAL #3   Title Pt will improve BERG by at least 3 points in order to demonstrate clinically significant improvement in balance.    Baseline 01/25/21: 49; 03/01/2021; 03/01/2021    Time 12    Period Weeks     Status Achieved    Target Date 03/01/21                   Plan - 03/08/21 1404     Clinical Impression Statement Patient demonstrates excellent motivation during session today. Session today focused on isolated strengthening of bilateral lower extremities.  He continues to demonstrate  fatigue with increased repetitions and resistance.  Will update outcome measures and goals the next session and plan for discharge.  Patient encouraged to follow-up scheduled.  He will benefit from PT services to address deficits in strength, balance, and mobility in order to return to full function at home.    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    PT Frequency 2x / week    PT Duration 12 weeks    PT Treatment/Interventions ADLs/Self Care Home Management;Aquatic Therapy;Biofeedback;Canalith Repostioning;Iontophoresis 37m/ml Dexamethasone;Cryotherapy;Electrical Stimulation;Moist Heat;Traction;Ultrasound;DME Instruction;Gait training;Stair training;Functional mobility training;Therapeutic activities;Therapeutic exercise;Balance training;Neuromuscular re-education;Patient/family education;Manual techniques;Passive range of motion;Dry needling;Vestibular;Spinal Manipulations;Joint Manipulations    PT Next Visit Plan Update outcome measures and goals, review HEP, discharge    PT Home Exercise Plan Access Code: [email or web address]    Consulted and Agree with Plan of Care Patient                Patient will benefit from skilled therapeutic intervention in order to improve the following deficits and impairments:  Abnormal gait,  Decreased range of motion, Decreased strength, Decreased balance  Visit Diagnosis: Muscle weakness (generalized)  Unsteadiness on feet     Problem List Patient Active Problem List   Diagnosis Date Noted   History of colon polyps    Family history of brain cancer    Family history of cancer    Family history of lung cancer    Primary osteoarthritis of left knee 09/15/2015   JPhillips GroutPT, DPT, GCS  Kihanna Kamiya, PT 03/08/2021, 2:08 PM  [facility] [place] [address]Phone: [phone]  Fax:  [phone] Name: Curtis RIERAMRN: [id number] of Birth: 106/17/1947

## 2021-03-13 ENCOUNTER — Ambulatory Visit: Payer: Medicare Other

## 2021-03-13 ENCOUNTER — Other Ambulatory Visit: Payer: Self-pay

## 2021-03-13 DIAGNOSIS — R2681 Unsteadiness on feet: Secondary | ICD-10-CM

## 2021-03-13 DIAGNOSIS — M6281 Muscle weakness (generalized): Secondary | ICD-10-CM | POA: Diagnosis not present

## 2021-03-13 NOTE — Therapy (Signed)
[facility] [address] [place], [place], [address] Phone: [phone]   Fax:  [phone]  Physical Therapy Treatment/Discharge  Patient Details  Name: Curtis Carroll MRN: [id number] Date of Birth: 05-Oct-1945 Referring Provider (PT): Dr. Ronnald Collum   Encounter Date: 03/13/2021   PT End of Session - 03/13/21 1053     Visit Number 13    Number of Visits 25    Date for PT Re-Evaluation 04/19/21    Authorization Type eval: 01/25/21    PT Start Time 1100    PT Stop Time 1145    PT Time Calculation (min) 45 min    Equipment Utilized During Treatment Gait belt    Activity Tolerance Patient tolerated treatment well    Behavior During Therapy WFL for tasks assessed/performed                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   Chronic ankle pain    Colon polyp    Enthesopathy of ankle/tarsus    Family history of brain cancer    Family history of cancer    Family history of lung cancer    History of colon polyps    Hyperlipidemia    Hyperlipidemia    Hypertension    Polio     Past Surgical History:  Procedure Laterality Date   ANKLE ARTHROCENTESIS Right    COLONOSCOPY WITH PROPOFOL N/A 03/25/2017   Procedure: COLONOSCOPY WITH PROPOFOL;  Surgeon: Lollie Sails, MD;  Location: [facility] ENDOSCOPY;  Service: Endoscopy;  Laterality: N/A;   COLONOSCOPY WITH PROPOFOL N/A 01/01/2019   Procedure: COLONOSCOPY WITH PROPOFOL;  Surgeon: Lollie Sails, MD;  Location: [facility] ENDOSCOPY;  Service: Endoscopy;  Laterality: N/A;   ESOPHAGOGASTRODUODENOSCOPY (EGD) WITH PROPOFOL N/A 11/06/2019   Procedure: ESOPHAGOGASTRODUODENOSCOPY (EGD) WITH PROPOFOL;  Surgeon: Robert Bellow, MD;  Location: [facility] ENDOSCOPY;  Service: Endoscopy;  Laterality: N/A;   KNEE ARTHROSCOPY Left    pins in toes Right    SHOULDER ARTHROSCOPY Right    TOTAL KNEE  ARTHROPLASTY Left 09/15/2015   Procedure: TOTAL KNEE ARTHROPLASTY;  Surgeon: Hessie Knows, MD;  Location: [facility] ORS;  Service: Orthopedics;  Laterality: Left;    There were no vitals filed for this visit.   Subjective Assessment - 03/13/21 1052     Subjective Pt states that he is doing well today. He is very happy with all that he has accomplished since starting therapy and feels confident he can continue to make progress at home. No pain reported upon arrival and no specific questions/concerns currently.    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 Arrives complaining of bilateral LE weakness which has been worsening over the last 6 months, worse in RLE. He has significant RLE atrophy and RLE is 3/4" shorter than the LLE. Pt wears extra insoles to account for the length difference. He had a fall 6 months ago with a L shoulder injury (RTC tear). He has noticed more difficulty transferring from sit to stand with limited ability to push off with LUE secondary to injury. Pt reports R ankle achilles lengthening and arthrodesis at 75 years old. He has pain in R foot/ankle if he tries to walk on hardwood floor without shoes. 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 He has had 2 falls in the last 6 months (last one  was a few days ago).    Limitations Standing;Other (comment)    Diagnostic tests None reported                  TREATMENT     Ther-ex  NuStep L2-4 x 5 minutes BLE only;  Updated goals with patient: FOTO: 58 5TSTS: 13.0s BERG: 53/56  Standing mini squats with heels elevated x 10; Nautilus resisted gait 80# forward, R lateral, L lateral, backwards x 3 each; Supine SLR with 3# ankle weight (AW) RLE x 20, x 17 (muscle failure), LLE: 2 x 20; Hooklying bridges x 20; Hooklying clams with manual resistance from therapist x 20; Hooklying adductor squeeze with manual resistance from therapist 20;      Pt educated throughout session about proper posture and  technique with exercises. Improved exercise technique, movement at target joints, use of target muscles after min to mod verbal, visual, tactile cues.      Patient demonstrates excellent motivation during session today. He has made significant improvement in his strength and balance since starting with therapy. Updated outcome measures and goals with patient during session.  Unfortunately his FOTO remains unchanged at initial evaluation however his 5 Times Sit to Stand Test is significantly improved. He is able to complete the test in 13.0 seconds from a regular height chair.  His balance has also improved since initial evaluation with his BERG improving from 49/56 to 53/56.  Patient will be discharged today and will continue his HEP independently.                     PT Short Term Goals - 03/13/21 1053       PT SHORT TERM GOAL #1   Title Pt will be independent with HEP in order to improve strength and balance in order to decrease fall risk and improve function at home and with leisure activities.    Baseline 10/5: reports completing 2x/day    Time 6    Period Weeks    Status Achieved               PT Long Term Goals - 03/13/21 1053       PT LONG TERM GOAL #1   Title Pt will increase FOTO to at least 62 in order to demonstrate significant improvement in function related to LE weakness and imbalance.    Baseline 01/25/21: 61; 03/01/2021: 57; 03/13/21: 58    Time 12    Period Weeks    Status Not Met      PT LONG TERM GOAL #2   Title Pt will decrease 5TSTS by at least 3 seconds in order to demonstrate clinically significant improvement in LE strength.    Baseline 01/25/21: 19.4s; 03/01/21: 12.3 sec (19" surface per pt report?- small airex pad in seat); 03/13/21: 13.0s (no Airex pad);    Time 12    Period Weeks    Status Achieved      PT LONG TERM GOAL #3   Title Pt will improve BERG by at least 3 points in order to demonstrate clinically significant improvement in  balance.    Baseline 01/25/21: 49; 03/01/2021: 52; 03/13/21:    Time 12    Period Weeks    Status Achieved                   Plan - 03/13/21 1053     Clinical Impression Statement Patient demonstrates excellent motivation during session today. He has made significant improvement  in his strength and balance since starting with therapy. Updated outcome measures and goals with patient during session.  Unfortunately his FOTO remains unchanged at initial evaluation however his 5 Times Sit to Stand Test is significantly improved. He is able to complete the test in 13.0 seconds from a regular height chair.  His balance has also improved since initial evaluation with his BERG improving from 49/56 to 53/56.  Patient will be discharged today and will continue his HEP independently.    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    PT Frequency 2x / week    PT Duration 12 weeks    PT Treatment/Interventions ADLs/Self Care Home Management;Aquatic Therapy;Biofeedback;Canalith Repostioning;Iontophoresis 60m/ml Dexamethasone;Cryotherapy;Electrical Stimulation;Moist Heat;Traction;Ultrasound;DME Instruction;Gait training;Stair training;Functional mobility training;Therapeutic activities;Therapeutic exercise;Balance training;Neuromuscular re-education;Patient/family education;Manual techniques;Passive range of motion;Dry needling;Vestibular;Spinal Manipulations;Joint Manipulations    PT Next Visit Plan Discharge    PT Home Exercise Plan Access Code: [email or web address]    Consulted and Agree with Plan of Care Patient                 Patient will benefit from skilled therapeutic intervention in order to improve the following deficits and impairments:  Abnormal gait, Decreased range of motion, Decreased strength, Decreased balance  Visit Diagnosis: Muscle weakness (generalized)  Unsteadiness on feet     Problem List Patient Active Problem List   Diagnosis Date Noted   History of colon polyps    Family history of brain cancer    Family history of cancer    Family history of lung cancer    Primary osteoarthritis of left knee 09/15/2015   JPhillips GroutPT, DPT, GCS  Isador Castille, PT 03/13/2021, 1:34 PM  [place] [facility] [place] [address]Phone: [phone]  Fax:  [phone] Name: AANTON CHERAMIEMRN: [id number] of Birth: 1March 27, 1947

## 2021-11-13 ENCOUNTER — Ambulatory Visit
Admission: RE | Admit: 2021-11-13 | Discharge: 2021-11-13 | Disposition: A | Discharge status: Home / Self Care | Payer: Medicare Other | Source: Ambulatory Visit | Attending: Gastroenterology | Admitting: Gastroenterology

## 2021-11-13 ENCOUNTER — Encounter
Admission: RE | Disposition: A | Discharge status: Home / Self Care | Payer: Self-pay | Source: Ambulatory Visit | Attending: Gastroenterology

## 2021-11-13 ENCOUNTER — Ambulatory Visit: Payer: Medicare Other | Admitting: Anesthesiology

## 2021-11-13 ENCOUNTER — Other Ambulatory Visit: Payer: Self-pay

## 2021-11-13 ENCOUNTER — Encounter: Payer: Self-pay | Admitting: Anesthesiology

## 2021-11-13 DIAGNOSIS — Z79890 Hormone replacement therapy (postmenopausal): Secondary | ICD-10-CM | POA: Insufficient documentation

## 2021-11-13 DIAGNOSIS — K900 Celiac disease: Secondary | ICD-10-CM | POA: Diagnosis not present

## 2021-11-13 DIAGNOSIS — Z87891 Personal history of nicotine dependence: Secondary | ICD-10-CM | POA: Diagnosis not present

## 2021-11-13 DIAGNOSIS — Z86010 Personal history of colonic polyps: Secondary | ICD-10-CM | POA: Insufficient documentation

## 2021-11-13 DIAGNOSIS — K6389 Other specified diseases of intestine: Secondary | ICD-10-CM | POA: Insufficient documentation

## 2021-11-13 DIAGNOSIS — I10 Essential (primary) hypertension: Secondary | ICD-10-CM | POA: Insufficient documentation

## 2021-11-13 DIAGNOSIS — Z79899 Other long term (current) drug therapy: Secondary | ICD-10-CM | POA: Diagnosis not present

## 2021-11-13 DIAGNOSIS — K219 Gastro-esophageal reflux disease without esophagitis: Secondary | ICD-10-CM | POA: Insufficient documentation

## 2021-11-13 DIAGNOSIS — E785 Hyperlipidemia, unspecified: Secondary | ICD-10-CM | POA: Insufficient documentation

## 2021-11-13 DIAGNOSIS — E039 Hypothyroidism, unspecified: Secondary | ICD-10-CM | POA: Insufficient documentation

## 2021-11-13 DIAGNOSIS — K640 First degree hemorrhoids: Secondary | ICD-10-CM | POA: Diagnosis not present

## 2021-11-13 DIAGNOSIS — Z09 Encounter for follow-up examination after completed treatment for conditions other than malignant neoplasm: Secondary | ICD-10-CM | POA: Diagnosis present

## 2021-11-13 HISTORY — PX: COLONOSCOPY WITH PROPOFOL: SHX5780

## 2021-11-13 SURGERY — COLONOSCOPY WITH PROPOFOL
Anesthesia: General

## 2021-11-13 MED ORDER — LIDOCAINE HCL (CARDIAC) PF 100 MG/5ML IV SOSY
PREFILLED_SYRINGE | INTRAVENOUS | Status: DC | PRN
  Administered 2021-11-13: 30 mg via INTRAVENOUS

## 2021-11-13 MED ORDER — PROPOFOL 1000 MG/100ML IV EMUL
INTRAVENOUS | Status: AC
  Filled 2021-11-13: qty 100

## 2021-11-13 MED ORDER — SODIUM CHLORIDE 0.9 % IV SOLN: INTRAVENOUS | Status: DC

## 2021-11-13 MED ORDER — LIDOCAINE HCL (PF) 2 % IJ SOLN
INTRAMUSCULAR | Status: AC
  Filled 2021-11-13: qty 5

## 2021-11-13 MED ORDER — PROPOFOL 500 MG/50ML IV EMUL
INTRAVENOUS | Status: DC | PRN
  Administered 2021-11-13: 150 ug/kg/min via INTRAVENOUS

## 2021-11-13 MED ORDER — EPHEDRINE 5 MG/ML INJ
INTRAVENOUS | Status: AC
  Filled 2021-11-13: qty 5

## 2021-11-13 NOTE — Transfer of Care (Signed)
Immediate Anesthesia Transfer of Care Note  Patient: Curtis Carroll  Procedure(s) Performed: COLONOSCOPY WITH PROPOFOL  Patient Location: PACU  Anesthesia Type:General  Level of Consciousness: awake and sedated  Airway & Oxygen Therapy: Patient Spontanous Breathing and Patient connected to face mask oxygen  Post-op Assessment: Report given to RN  Post vital signs: Reviewed and stable  Last Vitals:  Vitals Value Taken Time  BP    Temp    Pulse    Resp    SpO2      Last Pain:  Vitals:   11/13/21 1233  TempSrc: Temporal  PainSc: 0-No pain         Complications: No notable events documented.

## 2021-11-13 NOTE — Interval H&P Note (Signed)
History and Physical Interval Note:  11/13/2021 1:31 PM  Curtis Carroll  has presented today for surgery, with the diagnosis of History of adenomatous polyp of colon (Z86.010).  The various methods of treatment have been discussed with the patient and family. After consideration of risks, benefits and other options for treatment, the patient has consented to  Procedure(s): COLONOSCOPY WITH PROPOFOL (N/A) as a surgical intervention.  The patient's history has been reviewed, patient examined, no change in status, stable for surgery.  I have reviewed the patient's chart and labs.  Questions were answered to the patient's satisfaction.     Lesly Rubenstein  Ok to proceed with colonoscopy

## 2021-11-13 NOTE — Op Note (Signed)
[facility] Gastroenterology Patient Name: Curtis Carroll Procedure Date: 11/13/2021 1:23 PM MRN: [id number] Account #: [id number] Date of Birth: [birth year] Admit Type: Outpatient Age: 76 Room: [facility] ENDO ROOM 3 Gender: Male Note Status: Finalized Instrument Name: Jasper Riling [id number] Procedure:             Colonoscopy Indications:           Surveillance: Personal history of adenomatous polyps                         on last colonoscopy 3 years ago Providers:             Andrey Farmer MD, MD Medicines:             Monitored Anesthesia Care Complications:         No immediate complications. Estimated blood loss:                         Minimal. Procedure:             Pre-Anesthesia Assessment:                        - Prior to the procedure, a History and Physical was                         performed, and patient medications and allergies were                         reviewed. The patient is competent. The risks and                         benefits of the procedure and the sedation options and                         risks were discussed with the patient. All questions                         were answered and informed consent was obtained.                         Patient identification and proposed procedure were                         verified by the physician, the nurse, the                         anesthesiologist, the anesthetist and the technician                         in the endoscopy suite. Mental Status Examination:                         alert and oriented. Airway Examination: normal                         oropharyngeal airway and neck mobility. Respiratory                         Examination: clear to auscultation. CV Examination:  normal. Prophylactic Antibiotics: The patient does not                         require prophylactic antibiotics. Prior                         Anticoagulants: The patient has taken no previous                          anticoagulant or antiplatelet agents. ASA Grade                         Assessment: II - A patient with mild systemic disease.                         After reviewing the risks and benefits, the patient                         was deemed in satisfactory condition to undergo the                         procedure. The anesthesia plan was to use monitored                         anesthesia care (MAC). Immediately prior to                         administration of medications, the patient was                         re-assessed for adequacy to receive sedatives. The                         heart rate, respiratory rate, oxygen saturations,                         blood pressure, adequacy of pulmonary ventilation, and                         response to care were monitored throughout the                         procedure. The physical status of the patient was                         re-assessed after the procedure.                        After obtaining informed consent, the colonoscope was                         passed under direct vision. Throughout the procedure,                         the patient's blood pressure, pulse, and oxygen                         saturations were monitored continuously. The  Colonoscope was introduced through the anus and                         advanced to the the cecum, identified by appendiceal                         orifice and ileocecal valve. The colonoscopy was                         performed without difficulty. The patient tolerated                         the procedure well. The quality of the bowel                         preparation was good. Findings:      The perianal and digital rectal examinations were normal.      A localized area of mildly nodular mucosa was found at the appendiceal       orifice. Biopsies were taken with a cold forceps for histology.       Estimated blood loss was minimal.       A post polypectomy scar was found in the ascending colon. There was       residual polyp tissue. The polyp was removed with a cold snare.       Resection and retrieval were complete. Estimated blood loss was minimal.      Internal hemorrhoids were found during retroflexion. The hemorrhoids       were Grade I (internal hemorrhoids that do not prolapse).      The exam was otherwise without abnormality on direct and retroflexion       views. Impression:            - Nodular mucosa at the appendiceal orifice. Biopsied.                        - Post-polypectomy scar in the ascending colon.                        - Internal hemorrhoids.                        - The examination was otherwise normal on direct and                         retroflexion views. Recommendation:        - Discharge patient to home.                        - Resume previous diet.                        - Continue present medications.                        - Await pathology results.                        - Repeat colonoscopy for surveillance based on  pathology results.                        - Return to referring physician as previously                         scheduled. Procedure Code(s):     --- Professional ---                        [phone], Colonoscopy, flexible; with removal of                         tumor(s), polyp(s), or other lesion(s) by snare                         technique                        [id number], 69, Colonoscopy, flexible; with biopsy, single                         or multiple Diagnosis Code(s):     --- Professional ---                        Z86.010, Personal history of colonic polyps                        K63.89, Other specified diseases of intestine                        Z98.890, Other specified postprocedural states                        K64.0, First degree hemorrhoids CPT copyright 2019 American Medical Association. All rights reserved. The codes documented in this  report are preliminary and upon coder review may  be revised to meet current compliance requirements. Andrey Farmer MD, MD 11/13/2021 2:00:53 PM Number of Addenda: 0 Note Initiated On: 11/13/2021 1:23 PM Scope Withdrawal Time: 0 hours 10 minutes 4 seconds  Total Procedure Duration: 0 hours 19 minutes 2 seconds  Estimated Blood Loss:  Estimated blood loss was minimal.      [facility]

## 2021-11-13 NOTE — H&P (Signed)
Outpatient short stay form Pre-procedure 11/13/2021  Lesly Rubenstein, MD  Primary Physician: Lenard Simmer, MD  Reason for visit:  Surveillance  History of present illness:    76 y/o gentleman with hypothyroidism, hypertension, and GERD here for colonoscopy for history of what appears piecemeal resection of large ascending colon polyp. He also has history of multiple other Ta's. No family history of GI malignancies. No blood thinners. No significant abdominal surgeries. He does bleed significantly after procedures including dental work. Has normal platelets and INR. Has never been evaluated by hematology in the past.    Current Facility-Administered Medications:    0.9 %  sodium chloride infusion, , Intravenous, Continuous, Zachry Hopfensperger, Hilton Cork, MD, Last Rate: 20 mL/hr at 11/13/21 1326, Continued from Pre-op at 11/13/21 1326  Medications Prior to Admission  Medication Sig Dispense Refill Last Dose   aspirin EC 81 MG tablet Take 81 mg by mouth daily.   Past Month   atorvastatin (LIPITOR) 10 MG tablet Take 5 mg by mouth daily at 6 PM.   Past Week   levothyroxine (SYNTHROID, LEVOTHROID) 100 MCG tablet Take 100 mcg by mouth daily before breakfast.   11/13/2021   acetaminophen (TYLENOL) 500 MG tablet Take 500 mg by mouth every 6 (six) hours as needed.      Eszopiclone (ESZOPICLONE) 3 MG TABS Take 3 mg by mouth at bedtime. Take immediately before bedtime      lisinopril (PRINIVIL,ZESTRIL) 10 MG tablet Take 10 mg by mouth at bedtime.   11/11/2021   multivitamin-iron-minerals-folic acid (CENTRUM) chewable tablet Chew 1 tablet by mouth daily.      ondansetron (ZOFRAN) 4 MG tablet Take 1 tablet (4 mg total) by mouth every 6 (six) hours as needed for nausea. (Patient not taking: Reported on 11/06/2019) 20 tablet 0    pantoprazole (PROTONIX) 40 MG tablet Take 40 mg by mouth daily.        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    Physical Exam  Gen: Alert, oriented. Appears stated age.  HEENT: PERRLA. Lungs: No respiratory distress CV: RRR Abd: soft, benign, no masses Ext: No edema    Planned procedures: Proceed with colonoscopy. The patient understands the nature of the planned procedure, indications, risks, alternatives and potential complications including but not limited to bleeding, infection, perforation, damage to internal organs and possible oversedation/side effects from anesthesia. The patient agrees and gives consent to proceed.  Please refer to procedure notes for findings, recommendations and patient disposition/instructions.     Lesly Rubenstein, MD [facility] Gastroenterology

## 2021-11-13 NOTE — Anesthesia Procedure Notes (Signed)
Date/Time: 11/13/2021 1:34 PM  Performed by: Donalda Ewings, CindyPre-anesthesia Checklist: Patient identified, Emergency Drugs available, Suction available, Patient being monitored and Timeout performed Patient Re-evaluated:Patient Re-evaluated prior to induction Oxygen Delivery Method: Nasal cannula Preoxygenation: Pre-oxygenation with 100% oxygen Induction Type: IV induction Placement Confirmation: positive ETCO2 and CO2 detector

## 2021-11-13 NOTE — Anesthesia Preprocedure Evaluation (Signed)
Anesthesia Evaluation  Patient identified by MRN, date of birth, ID band Patient awake    Reviewed: Allergy & Precautions, H&P , NPO status , Patient's Chart, lab work & pertinent test results, reviewed documented beta blocker date and time   Airway Mallampati: II   Neck ROM: full    Dental  (+) Poor Dentition   Pulmonary neg pulmonary ROS, former smoker,    Pulmonary exam normal        Cardiovascular Exercise Tolerance: Good hypertension, On Medications negative cardio ROS Normal cardiovascular exam Rhythm:regular Rate:Normal     Neuro/Psych negative neurological ROS  negative psych ROS   GI/Hepatic negative GI ROS, Neg liver ROS,   Endo/Other  negative endocrine ROS  Renal/GU negative Renal ROS  negative genitourinary   Musculoskeletal   Abdominal   Peds  Hematology negative hematology ROS (+)   Anesthesia Other Findings Past Medical History: No date: Arthritis 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 No date: Celiac disease     Comment:  allergy to wheat grain No date: Chronic ankle pain No date: Colon polyp No date: Enthesopathy of ankle/tarsus No date: Family history of brain cancer No date: Family history of cancer No date: Family history of lung cancer No date: History of colon polyps No date: Hyperlipidemia No date: Hypertension No date: Polio Past Surgical History: No date: ANKLE ARTHROCENTESIS; Right 03/25/2017: COLONOSCOPY WITH PROPOFOL; N/A     Comment:  Procedure: COLONOSCOPY WITH PROPOFOL;  Surgeon:               Lollie Sails, MD;  Location: [facility] ENDOSCOPY;                Service: Endoscopy;  Laterality: N/A; 01/01/2019: COLONOSCOPY WITH PROPOFOL; N/A     Comment:  Procedure: COLONOSCOPY WITH PROPOFOL;  Surgeon:               Lollie Sails, MD;  Location: [facility] ENDOSCOPY;                Service: Endoscopy;  Laterality: N/A; 11/06/2019: ESOPHAGOGASTRODUODENOSCOPY (EGD) WITH PROPOFOL;  N/A     Comment:  Procedure: ESOPHAGOGASTRODUODENOSCOPY (EGD) WITH               PROPOFOL;  Surgeon: Robert Bellow, MD;  Location:               [facility] ENDOSCOPY;  Service: Endoscopy;  Laterality: N/A; No date: KNEE ARTHROSCOPY; Left No date: pins in toes; Right No date: SHOULDER ARTHROSCOPY; Right 09/15/2015: TOTAL KNEE ARTHROPLASTY; Left     Comment:  Procedure: TOTAL KNEE ARTHROPLASTY;  Surgeon: Hessie Knows, MD;  Location: [facility] ORS;  Service: Orthopedics;                Laterality: Left; BMI    Body Mass Index: 29.27 kg/m     Reproductive/Obstetrics negative OB ROS                             Anesthesia Physical Anesthesia Plan  ASA: 3  Anesthesia Plan: General   Post-op Pain Management:    Induction:   PONV Risk Score and Plan:   Airway Management Planned:   Additional Equipment:   Intra-op Plan:   Post-operative Plan:   Informed Consent: I have reviewed the patients History and Physical, chart, labs and discussed the procedure including the risks, benefits and  alternatives for the proposed anesthesia with the patient or authorized representative who has indicated his/her understanding and acceptance.     Dental Advisory Given  Plan Discussed with: CRNA  Anesthesia Plan Comments:         Anesthesia Quick Evaluation

## 2021-11-14 ENCOUNTER — Encounter: Payer: Self-pay | Admitting: Gastroenterology

## 2021-11-14 LAB — SURGICAL PATHOLOGY

## 2021-11-14 NOTE — Anesthesia Postprocedure Evaluation (Signed)
Anesthesia Post Note  Patient: Curtis Carroll  Procedure(s) Performed: COLONOSCOPY WITH PROPOFOL  Patient location during evaluation: PACU Anesthesia Type: General Level of consciousness: awake and alert Pain management: pain level controlled Vital Signs Assessment: post-procedure vital signs reviewed and stable Respiratory status: spontaneous breathing, nonlabored ventilation, respiratory function stable and patient connected to nasal cannula oxygen Cardiovascular status: blood pressure returned to baseline and stable Postop Assessment: no apparent nausea or vomiting Anesthetic complications: no   No notable events documented.   Last Vitals:  Vitals:   11/13/21 1411 11/13/21 1421  BP: 114/68 125/71  Pulse: 73 69  Resp: (!) 40 18  Temp:    SpO2: 97% 100%    Last Pain:  Vitals:   11/14/21 0730  TempSrc:   PainSc: 0-No pain                 Molli Barrows

## 2022-12-17 ENCOUNTER — Ambulatory Visit: Payer: Medicare Other

## 2022-12-17 ENCOUNTER — Inpatient Hospital Stay
Admission: RE | Admit: 2022-12-17 | Discharge: 2022-12-17 | Disposition: A | Discharge status: Home / Self Care | Payer: 59 | Source: Ambulatory Visit

## 2022-12-17 ENCOUNTER — Ambulatory Visit
Admission: EM | Admit: 2022-12-17 | Discharge: 2022-12-17 | Disposition: A | Discharge status: Home / Self Care | Payer: Medicare Other | Attending: Family Medicine | Admitting: Family Medicine

## 2022-12-17 DIAGNOSIS — M25552 Pain in left hip: Secondary | ICD-10-CM

## 2022-12-17 DIAGNOSIS — M1612 Unilateral primary osteoarthritis, left hip: Secondary | ICD-10-CM

## 2022-12-17 MED ORDER — PREDNISONE 10 MG (21) PO TBPK: ORAL_TABLET | Freq: Every day | ORAL | 0 refills | Status: DC

## 2022-12-17 MED ORDER — TIZANIDINE HCL 4 MG PO TABS: 4.0000 mg | ORAL_TABLET | Freq: Three times a day (TID) | ORAL | 0 refills | Status: DC | PRN

## 2022-12-17 MED ORDER — TRAMADOL HCL 50 MG PO TABS: 50.0000 mg | ORAL_TABLET | Freq: Four times a day (QID) | ORAL | 0 refills | Status: DC | PRN

## 2022-12-17 NOTE — ED Triage Notes (Signed)
Pt c/o L leg pain x14 days. States he pulled a muscle while trying to get up on a ladder. Pain radiates up to knee.

## 2022-12-17 NOTE — Discharge Instructions (Addendum)
Your xray showed some age related changes / arthritis but no fractured or dislocated bones. Follow up with your orthopedic provider.

## 2022-12-17 NOTE — ED Provider Notes (Signed)
[facility]    CSN: [id number] Arrival date & time: 12/17/22  1411      History   Chief Complaint Chief Complaint  Patient presents with   Leg Pain    Appt    HPI  HPI Curtis Carroll is a 77 y.o. male.   Curtis Carroll presents for left leg pain for the past 2-3 weeks after getting out of the swimming pool. 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 His pain at times radiates to his knee. He had a knee replacement 7-8 years ago.   At times, has left hip pain but this is not new but it seems to be worse. Has anterior and lateral hip pain. He taking aspirin and ibuprofen while applying icy hot and Biofreeze. These help but then it wears off.  Denies falls or trauma. The left knee pops since his replacement.      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   Chronic ankle pain    Colon polyp    Enthesopathy of ankle/tarsus    Family history of brain cancer    Family history of cancer    Family history of lung cancer    History of colon polyps    Hyperlipidemia    Hypertension    Polio     Patient Active Problem List   Diagnosis Date Noted   History of colon polyps    Family history of brain cancer    Family history of cancer    Family history of lung cancer    Primary osteoarthritis of left knee 09/15/2015    Past Surgical History:  Procedure Laterality Date   ANKLE ARTHROCENTESIS Right    COLONOSCOPY WITH PROPOFOL N/A 03/25/2017   Procedure: COLONOSCOPY WITH PROPOFOL;  Surgeon: Christena Deem, MD;  Location: [facility] ENDOSCOPY;  Service: Endoscopy;  Laterality: N/A;   COLONOSCOPY WITH PROPOFOL N/A 01/01/2019   Procedure: COLONOSCOPY WITH PROPOFOL;  Surgeon: Christena Deem, MD;  Location: [facility] ENDOSCOPY;  Service: Endoscopy;  Laterality: N/A;   COLONOSCOPY WITH PROPOFOL N/A 11/13/2021   Procedure: COLONOSCOPY WITH PROPOFOL;   Surgeon: Regis Bill, MD;  Location: [facility] ENDOSCOPY;  Service: Endoscopy;  Laterality: N/A;   ESOPHAGOGASTRODUODENOSCOPY (EGD) WITH PROPOFOL N/A 11/06/2019   Procedure: ESOPHAGOGASTRODUODENOSCOPY (EGD) WITH PROPOFOL;  Surgeon: Earline Mayotte, MD;  Location: [facility] ENDOSCOPY;  Service: Endoscopy;  Laterality: N/A;   KNEE ARTHROSCOPY Left    pins in toes Right    SHOULDER ARTHROSCOPY Right    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 Authorizing Provider  acetaminophen (TYLENOL) 500 MG tablet Take 500 mg by mouth every 6 (six) hours as needed.   Yes [provider]  aspirin EC 81 MG tablet Take 81 mg by mouth daily.   Yes [provider]  atorvastatin (LIPITOR) 10 MG tablet Take 5 mg by mouth daily at 6 PM.   Yes [provider]  Cholecalciferol (D 1000) 25 MCG (1000 UT) capsule Take by mouth.   Yes [provider]  Eszopiclone (ESZOPICLONE) 3 MG TABS Take 3  mg by mouth at bedtime. Take immediately before bedtime   Yes [provider]  gatifloxacin (ZYMAXID) 0.5 % SOLN Place 1 drop into the right eye 3 (three) times daily. 11/27/22  Yes [provider]  ketorolac (ACULAR) 0.5 % ophthalmic solution Place 1 drop into the right eye 4 (four) times daily. 11/27/22  Yes [provider]  levothyroxine (SYNTHROID, LEVOTHROID) 100 MCG tablet Take 100 mcg by mouth daily before breakfast.   Yes [provider]  lisinopril (PRINIVIL,ZESTRIL) 10 MG tablet Take 10 mg by mouth at bedtime.   Yes [provider]  multivitamin-iron-minerals-folic acid (CENTRUM) chewable tablet Chew 1 tablet by mouth daily.   Yes [provider]  pantoprazole (PROTONIX) 40 MG tablet Take 40 mg by mouth daily.    Yes [provider]  prednisoLONE acetate (PRED FORTE) 1 % ophthalmic suspension Place into the right eye. 11/27/22  Yes [provider]  predniSONE (STERAPRED UNI-PAK 21 TAB) 10 MG (21) TBPK tablet Take by mouth daily. Take 6 tabs by mouth daily for 1, then 5 tabs for 1 day, then 4 tabs for 1 day, then 3 tabs for 1 day, then 2 tabs for 1 day, then 1 tab for 1 day. 12/17/22  Yes Delight Bickle, DO  tiZANidine (ZANAFLEX) 4 MG tablet Take 1 tablet (4 mg total) by mouth every 8 (eight) hours as needed for muscle spasms. 12/17/22  Yes Puneet Masoner, DO  traMADol (ULTRAM) 50 MG tablet Take 1 tablet (50 mg total) by mouth every 6 (six) hours as needed. 12/17/22  Yes Amour Trigg, DO  ondansetron (ZOFRAN) 4 MG tablet Take 1 tablet (4 mg total) by mouth every 6 (six) hours as needed for nausea. Patient not taking: Reported on 11/06/2019 09/16/15   Evon Slack, PA-C    Family History Family History  Problem Relation Age of Onset   Brain cancer Maternal Uncle        dx. in his early 49s   Cancer Maternal Grandfather 27       unknown type   Cancer Other 60       unknown type, maternal cousin's daughter   Cancer Brother 32       cancer of the jaw   Cirrhosis Brother        liver, alcoholism   Lung cancer Brother 31       metastatic to brain, smoker   Cancer Other 60       unknown type, maternal cousin's daughter    Social History Social History   Tobacco Use   Smoking status: Former    Current packs/day: 0.00    Types: Cigarettes    Quit date: 11/06/2014    Years since quitting: 8.1   Smokeless tobacco: Never  Vaping Use   Vaping status: Never Used  Substance Use Topics   Alcohol use: No   Drug use: No     Allergies   Alcohol, Alcohol-sulfur [elemental sulfur], Codeine, Lactose intolerance (gi), Milk (cow), Penicillins, Piper, and Wheat   Review of Systems Review of Systems: :negative unless otherwise stated in HPI.      Physical Exam Triage Vital  Signs ED Triage Vitals  Encounter Vitals Group     BP 12/17/22 1420 (!) 167/73     Systolic BP Percentile --      Diastolic BP Percentile --      Pulse Rate 12/17/22 1420 62     Resp 12/17/22 1420 16     Temp 12/17/22  1420 97.8 F (36.6 C)     Temp Source 12/17/22 1420 Oral     SpO2 12/17/22 1420 96 %     Weight 12/17/22 1419 220 lb (99.8 kg)     Height 12/17/22 1419 5\' 11"  (1.803 m)     Head Circumference --      Peak Flow --      Pain Score 12/17/22 1424 5     Pain Loc --      Pain Education --      Exclude from Growth Chart --    No data found.  Updated Vital Signs BP (!) 152/77 (BP Location: Left Arm)   Pulse 62   Temp 97.8 F (36.6 C) (Oral)   Resp 16   Ht 5\' 11"  (1.803 m)   Wt 99.8 kg   SpO2 96%   BMI 30.68 kg/m   Visual Acuity Right Eye Distance:   Left Eye Distance:   Bilateral Distance:    Right Eye Near:   Left Eye Near:    Bilateral Near:     Physical Exam GEN: well appearing male in no acute distress  CVS: well perfused  RESP: speaking in full sentences without pause, no respiratory distress  MSK:  Left Knee Exam -Inspection: no deformity, no discoloration, surgical scars present, no edema -Palpation: no joint line tenderness, none tender to palpation  -ROM: Extension: good flexion and extension  -Limb neurovascularly intact, no instability noted  Hip, Left: TTP noted at greater trochanter, anterior groin. No obvious rash, deformity, erythema, ecchymosis, or edema. Pain eilicted with abduction, flexion, adduction. Strength 5/5.  Pelvic alignment unremarkable to inspection and palpation. Non-antalgic gait.  No tenderness over piriformis. No SI joint tenderness and normal minimal SI movement.  Neurovascularly intact. + FADIR   UC Treatments / Results  Labs (all labs ordered are listed, but only abnormal results are displayed) Labs Reviewed - No data to display  EKG   Radiology DG Hip Unilat With Pelvis 2-3 Views Left  Result Date:  12/17/2022 CLINICAL DATA:  Left leg pain for 14 days EXAM: DG HIP (WITH OR WITHOUT PELVIS) 2-3V LEFT COMPARISON:  CT 11/02/2019 FINDINGS: No fracture or malalignment. Mild SI joint degenerative change. Pubic symphysis and rami appear intact. Mild to moderate left hip degenerative change with joint space narrowing and osteophytes. Slightly aspherical femoral head neck junction as may be seen with impingement IMPRESSION: Mild to moderate left hip degenerative change. No acute osseous abnormality Electronically Signed   By: Jasmine Pang M.D.   On: 12/17/2022 15:35     Procedures Procedures (including critical care time)  Medications Ordered in UC Medications - No data to display  Initial Impression / Assessment and Plan / UC Course  I have reviewed the triage vital signs and the nursing notes.  Pertinent labs & imaging results that were available during my care of the patient were reviewed by me and considered in my medical decision making (see chart for details).      Pt is a 77 y.o.  male with acute on chronic left hip pain.  He has known osteoarthritis in multiple joints.   On exam, pt has tenderness at the greater trochanter and pain elicited with ROM.   Obtained left hip plain films.  Personally interpreted by me were unremarkable for fracture or dislocation. Does have evidence of osteoarthritis. Radiologist report reviewed and additionally notes mild SI joint degenerative changes with mild to moderate left hip degenerative changes with slightly aspherical femoral head  neck junction that can be seen with impingement.   Patient to gradually return to normal activities, as tolerated and continue ordinary activities within the limits permitted by pain. Prescribed tramadol to be used at nighttime, prednisone taper and muscle relaxer  for pain relief.  Tylenol PRN. Advised patient to avoid OTC NSAIDs while taking prescription NSAID. Counseled patient on red flag symptoms and when to seek immediate  care.    Patient to follow up with orthopedic provider, if symptoms do not improve with conservative treatment.  Return and ED precautions given. Understanding voiced. Discussed MDM, treatment plan and plan for follow-up with patient who agrees with plan.   Final Clinical Impressions(s) / UC Diagnoses   Final diagnoses:  Primary osteoarthritis of left hip  Left hip pain     Discharge Instructions      Your xray showed some age related changes / arthritis but no fractured or dislocated bones. Follow up with your orthopedic provider.      ED Prescriptions     Medication Sig Dispense Auth. Provider   predniSONE (STERAPRED UNI-PAK 21 TAB) 10 MG (21) TBPK tablet Take by mouth daily. Take 6 tabs by mouth daily for 1, then 5 tabs for 1 day, then 4 tabs for 1 day, then 3 tabs for 1 day, then 2 tabs for 1 day, then 1 tab for 1 day. 21 tablet Camey Edell, DO   tiZANidine (ZANAFLEX) 4 MG tablet Take 1 tablet (4 mg total) by mouth every 8 (eight) hours as needed for muscle spasms. 30 tablet Tacia Hindley, DO   traMADol (ULTRAM) 50 MG tablet Take 1 tablet (50 mg total) by mouth every 6 (six) hours as needed. 8 tablet Mathieu Schloemer, Seward Meth, DO      I have reviewed the PDMP during this encounter.   Katha Cabal, DO 12/17/22 1946

## 2023-01-01 ENCOUNTER — Other Ambulatory Visit: Payer: Self-pay | Admitting: Orthopedic Surgery

## 2023-01-01 DIAGNOSIS — M1612 Unilateral primary osteoarthritis, left hip: Secondary | ICD-10-CM

## 2023-01-01 DIAGNOSIS — M84359A Stress fracture, hip, unspecified, initial encounter for fracture: Secondary | ICD-10-CM

## 2023-01-02 ENCOUNTER — Ambulatory Visit
Admission: RE | Admit: 2023-01-02 | Discharge: 2023-01-02 | Disposition: A | Discharge status: Home / Self Care | Payer: Medicare Other | Source: Ambulatory Visit | Attending: Orthopedic Surgery | Admitting: Orthopedic Surgery

## 2023-01-02 DIAGNOSIS — M1612 Unilateral primary osteoarthritis, left hip: Secondary | ICD-10-CM | POA: Insufficient documentation

## 2023-01-10 HISTORY — PX: CATARACT EXTRACTION W/ INTRAOCULAR LENS IMPLANT: SHX1309

## 2023-01-31 HISTORY — PX: CATARACT EXTRACTION W/ INTRAOCULAR LENS IMPLANT: SHX1309

## 2023-02-04 ENCOUNTER — Other Ambulatory Visit: Payer: Self-pay | Admitting: Orthopedic Surgery

## 2023-02-06 ENCOUNTER — Other Ambulatory Visit: Payer: Self-pay

## 2023-02-06 ENCOUNTER — Encounter
Admission: RE | Admit: 2023-02-06 | Discharge: 2023-02-06 | Disposition: A | Discharge status: Home / Self Care | Payer: Medicare Other | Source: Ambulatory Visit | Attending: Orthopedic Surgery | Admitting: Orthopedic Surgery

## 2023-02-06 DIAGNOSIS — Z01818 Encounter for other preprocedural examination: Secondary | ICD-10-CM | POA: Insufficient documentation

## 2023-02-06 DIAGNOSIS — Z01812 Encounter for preprocedural laboratory examination: Secondary | ICD-10-CM

## 2023-02-06 DIAGNOSIS — Z01810 Encounter for preprocedural cardiovascular examination: Secondary | ICD-10-CM

## 2023-02-06 HISTORY — DX: Prediabetes: R73.03

## 2023-02-06 HISTORY — DX: Unilateral primary osteoarthritis, left hip: M16.12

## 2023-02-06 HISTORY — DX: Hypothyroidism, unspecified: E03.9

## 2023-02-06 HISTORY — DX: Personal history of other diseases of the digestive system: Z87.19

## 2023-02-06 HISTORY — DX: Gastritis, unspecified, without bleeding: K29.70

## 2023-02-06 LAB — URINALYSIS, ROUTINE W REFLEX MICROSCOPIC
Bacteria, UA: NONE SEEN
Bilirubin Urine: NEGATIVE
Glucose, UA: NEGATIVE mg/dL
Ketones, ur: NEGATIVE mg/dL
Leukocytes,Ua: NEGATIVE
Nitrite: NEGATIVE
Protein, ur: NEGATIVE mg/dL
Specific Gravity, Urine: 1.011 (ref 1.005–1.030)
Squamous Epithelial / HPF: 0 /HPF (ref 0–5)
WBC, UA: 0 WBC/hpf (ref 0–5)
pH: 6 (ref 5.0–8.0)

## 2023-02-06 LAB — CBC WITH DIFFERENTIAL/PLATELET
Abs Immature Granulocytes: 0.02 10*3/uL (ref 0.00–0.07)
Basophils Absolute: 0 10*3/uL (ref 0.0–0.1)
Basophils Relative: 0 %
Eosinophils Absolute: 0.1 10*3/uL (ref 0.0–0.5)
Eosinophils Relative: 1 %
HCT: 43.9 % (ref 39.0–52.0)
Hemoglobin: 14.3 g/dL (ref 13.0–17.0)
Immature Granulocytes: 0 %
Lymphocytes Relative: 17 %
Lymphs Abs: 0.9 10*3/uL (ref 0.7–4.0)
MCH: 31 pg (ref 26.0–34.0)
MCHC: 32.6 g/dL (ref 30.0–36.0)
MCV: 95.2 fL (ref 80.0–100.0)
Monocytes Absolute: 0.5 10*3/uL (ref 0.1–1.0)
Monocytes Relative: 10 %
Neutro Abs: 3.9 10*3/uL (ref 1.7–7.7)
Neutrophils Relative %: 72 %
Platelets: 201 10*3/uL (ref 150–400)
RBC: 4.61 MIL/uL (ref 4.22–5.81)
RDW: 13.2 % (ref 11.5–15.5)
WBC: 5.4 10*3/uL (ref 4.0–10.5)
nRBC: 0 % (ref 0.0–0.2)

## 2023-02-06 LAB — COMPREHENSIVE METABOLIC PANEL
ALT: 17 U/L (ref 0–44)
AST: 18 U/L (ref 15–41)
Albumin: 3.9 g/dL (ref 3.5–5.0)
Alkaline Phosphatase: 92 U/L (ref 38–126)
Anion gap: 4 — ABNORMAL LOW (ref 5–15)
BUN: 22 mg/dL (ref 8–23)
CO2: 26 mmol/L (ref 22–32)
Calcium: 9.1 mg/dL (ref 8.9–10.3)
Chloride: 108 mmol/L (ref 98–111)
Creatinine, Ser: 0.7 mg/dL (ref 0.61–1.24)
GFR, Estimated: >60 mL/min (ref >60)
Glucose, Bld: 86 mg/dL (ref 70–99)
Potassium: 3.9 mmol/L (ref 3.5–5.1)
Sodium: 138 mmol/L (ref 135–145)
Total Bilirubin: 0.5 mg/dL (ref 0.3–1.2)
Total Protein: 7 g/dL (ref 6.5–8.1)

## 2023-02-06 LAB — SURGICAL PCR SCREEN
MRSA, PCR: NEGATIVE
Staphylococcus aureus: NEGATIVE

## 2023-02-06 LAB — TYPE AND SCREEN
ABO/RH(D): O POS
Antibody Screen: NEGATIVE

## 2023-02-06 NOTE — Patient Instructions (Addendum)
Your procedure is scheduled on: Monday, September 23 Report to the Registration Desk on the 1st floor of the [facility] To find out your arrival time, please call [phone] between 1PM - 3PM on: Friday, September 20 If your arrival time is 6:00 am, do not arrive before that time as the [facility] entrance doors do not open until 6:00 am.  REMEMBER: Instructions that are not followed completely may result in serious medical risk, up to and including death; or upon the discretion of your surgeon and anesthesiologist your surgery may need to be rescheduled.  Do not eat food after midnight the night before surgery.  No gum chewing or hard candies.  You may however, drink CLEAR liquids up to 2 hours before you are scheduled to arrive for your surgery. Do not drink anything within 2 hours of your scheduled arrival time.  Clear liquids include: - water  - apple juice without pulp - gatorade (not RED colors) - black coffee or tea (Do NOT add milk or creamers to the coffee or tea) Do NOT drink anything that is not on this list.  In addition, your doctor has ordered for you to drink the provided:  Ensure Pre-Surgery Clear Carbohydrate Drink  Drinking this carbohydrate drink up to two hours before surgery helps to reduce insulin resistance and improve patient outcomes. Please complete drinking 2 hours before scheduled arrival time.  One week prior to surgery: starting September 16 Stop aspirin and Anti-inflammatories (NSAIDS) such as Advil, Aleve, Ibuprofen, Motrin, Naproxen, Naprosyn and Aspirin based products such as Excedrin, Goody's Powder, BC Powder. Stop ANY OVER THE COUNTER supplements until after surgery. Stop vitamin D You may however, continue to take Tylenol if needed for pain up until the day of surgery.  TAKE ONLY THESE MEDICATIONS THE MORNING OF SURGERY WITH A SIP OF WATER:  Levothyroxine Eye drops  No Alcohol for 24 hours before or after surgery.  No Smoking  including e-cigarettes for 24 hours before surgery.  No chewable tobacco products for at least 6 hours before surgery.  No nicotine patches on the day of surgery.  Do not use any "recreational" drugs for at least a week (preferably 2 weeks) before your surgery.  Please be advised that the combination of cocaine and anesthesia may have negative outcomes, up to and including death. If you test positive for cocaine, your surgery will be cancelled.  On the morning of surgery brush your teeth with toothpaste and water, you may rinse your mouth with mouthwash if you wish. Do not swallow any toothpaste or mouthwash.  Use CHG Soap as directed on instruction sheet.  Do not wear jewelry, make-up, hairpins, clips or nail polish.  For welded (permanent) jewelry: bracelets, anklets, waist bands, etc.  Please have this removed prior to surgery.  If it is not removed, there is a chance that hospital personnel will need to cut it off on the day of surgery.  Do not wear lotions, powders, or perfumes.   Do not shave body hair from the neck down 48 hours before surgery.  Contact lenses, hearing aids and dentures may not be worn into surgery.  Do not bring valuables to the hospital. [facility] is not responsible for any missing/lost belongings or valuables.   Notify your doctor if there is any change in your medical condition (cold, fever, infection).  Wear comfortable clothing (specific to your surgery type) to the hospital.  After surgery, you can help prevent lung complications by doing breathing exercises.  Take deep breaths and cough every 1-2 hours. Your doctor may order a device called an Incentive Spirometer to help you take deep breaths.  If you are being admitted to the hospital overnight, leave your suitcase in the car. After surgery it may be brought to your room.  In case of increased patient census, it may be necessary for you, the patient, to continue your postoperative care in the Same  Day Surgery department.  If you are being discharged the day of surgery, you will not be allowed to drive home. You will need a responsible individual to drive you home and stay with you for 24 hours after surgery.   If you are taking public transportation, you will need to have a responsible individual with you.  Please call the Pre-admissions Testing Dept. at [phone] if you have any questions about these instructions.  Surgery Visitation Policy:  Patients having surgery or a procedure may have two visitors.  Children under the age of 6 must have an adult with them who is not the patient.  Inpatient Visitation:    Visiting hours are 7 a.m. to 8 p.m. Up to four visitors are allowed at one time in a patient room. The visitors may rotate out with other people during the day.  One visitor age 49 or older may stay with the patient overnight and must be in the room by 8 p.m.    Pre-operative 5 CHG Bath Instructions   You can play a key role in reducing the risk of infection after surgery. Your skin needs to be as free of germs as possible. You can reduce the number of germs on your skin by washing with CHG (chlorhexidine gluconate) soap before surgery. CHG is an antiseptic soap that kills germs and continues to kill germs even after washing.   DO NOT use if you have an allergy to chlorhexidine/CHG or antibacterial soaps. If your skin becomes reddened or irritated, stop using the CHG and notify one of our RNs at [phone].   Please shower with the CHG soap starting 4 days before surgery using the following schedule:     Please keep in mind the following:  DO NOT shave, including legs and underarms, starting the day of your first shower.   You may shave your face at any point before/day of surgery.  Place clean sheets on your bed the day you start using CHG soap. Use a clean washcloth (not used since being washed) for each shower. DO NOT sleep with pets once you start using  the CHG.   CHG Shower Instructions:  If you choose to wash your hair and private area, wash first with your normal shampoo/soap.  After you use shampoo/soap, rinse your hair and body thoroughly to remove shampoo/soap residue.  Turn the water OFF and apply about 3 tablespoons (45 ml) of CHG soap to a CLEAN washcloth.  Apply CHG soap ONLY FROM YOUR NECK DOWN TO YOUR TOES (washing for 3-5 minutes)  DO NOT use CHG soap on face, private areas, open wounds, or sores.  Pay special attention to the area where your surgery is being performed.  If you are having back surgery, having someone wash your back for you may be helpful. Wait 2 minutes after CHG soap is applied, then you may rinse off the CHG soap.  Pat dry with a clean towel  Put on clean clothes/pajamas   If you choose to wear lotion, please use ONLY the CHG-compatible lotions on the back  of this paper.     Additional instructions for the day of surgery: DO NOT APPLY any lotions, deodorants, cologne, or perfumes.   Put on clean/comfortable clothes.  Brush your teeth.  Ask your nurse before applying any prescription medications to the skin.      CHG Compatible Lotions   Aveeno Moisturizing lotion  Cetaphil Moisturizing Cream  Cetaphil Moisturizing Lotion  Clairol Herbal Essence Moisturizing Lotion, Dry Skin  Clairol Herbal Essence Moisturizing Lotion, Extra Dry Skin  Clairol Herbal Essence Moisturizing Lotion, Normal Skin  Curel Age Defying Therapeutic Moisturizing Lotion with Alpha Hydroxy  Curel Extreme Care Body Lotion  Curel Soothing Hands Moisturizing Hand Lotion  Curel Therapeutic Moisturizing Cream, Fragrance-Free  Curel Therapeutic Moisturizing Lotion, Fragrance-Free  Curel Therapeutic Moisturizing Lotion, Original Formula  Eucerin Daily Replenishing Lotion  Eucerin Dry Skin Therapy Plus Alpha Hydroxy Crme  Eucerin Dry Skin Therapy Plus Alpha Hydroxy Lotion  Eucerin Original Crme  Eucerin Original Lotion  Eucerin  Plus Crme Eucerin Plus Lotion  Eucerin TriLipid Replenishing Lotion  Keri Anti-Bacterial Hand Lotion  Keri Deep Conditioning Original Lotion Dry Skin Formula Softly Scented  Keri Deep Conditioning Original Lotion, Fragrance Free Sensitive Skin Formula  Keri Lotion Fast Absorbing Fragrance Free Sensitive Skin Formula  Keri Lotion Fast Absorbing Softly Scented Dry Skin Formula  Keri Original Lotion  Keri Skin Renewal Lotion Keri Silky Smooth Lotion  Keri Silky Smooth Sensitive Skin Lotion  Nivea Body Creamy Conditioning Oil  Nivea Body Extra Enriched Lotion  Nivea Body Original Lotion  Nivea Body Sheer Moisturizing Lotion Nivea Crme  Nivea Skin Firming Lotion  NutraDerm 30 Skin Lotion  NutraDerm Skin Lotion  NutraDerm Therapeutic Skin Cream  NutraDerm Therapeutic Skin Lotion  ProShield Protective Hand Cream  Provon moisturizing lotion   How to Use an Incentive Spirometer  An incentive spirometer is a tool that measures how well you are filling your lungs with each breath. Learning to take long, deep breaths using this tool can help you keep your lungs clear and active. This may help to reverse or lessen your chance of developing breathing (pulmonary) problems, especially infection. You may be asked to use a spirometer: After a surgery. If you have a lung problem or a history of smoking. After a long period of time when you have been unable to move or be active. If the spirometer includes an indicator to show the highest number that you have reached, your health care provider or respiratory therapist will help you set a goal. Keep a log of your progress as told by your health care provider. What are the risks? Breathing too quickly may cause dizziness or cause you to pass out. Take your time so you do not get dizzy or light-headed. If you are in pain, you may need to take pain medicine before doing incentive spirometry. It is harder to take a deep breath if you are having pain. How to  use your incentive spirometer  Sit up on the edge of your bed or on a chair. Hold the incentive spirometer so that it is in an upright position. Before you use the spirometer, breathe out normally. Place the mouthpiece in your mouth. Make sure your lips are closed tightly around it. Breathe in slowly and as deeply as you can through your mouth, causing the piston or the ball to rise toward the top of the chamber. Hold your breath for 3-5 seconds, or for as long as possible. If the spirometer includes a coach indicator, use this  to guide you in breathing. Slow down your breathing if the indicator goes above the marked areas. Remove the mouthpiece from your mouth and breathe out normally. The piston or ball will return to the bottom of the chamber. Rest for a few seconds, then repeat the steps 10 or more times. Take your time and take a few normal breaths between deep breaths so that you do not get dizzy or light-headed. Do this every 1-2 hours when you are awake. If the spirometer includes a goal marker to show the highest number you have reached (best effort), use this as a goal to work toward during each repetition. After each set of 10 deep breaths, cough a few times. This will help to make sure that your lungs are clear. If you have an incision on your chest or abdomen from surgery, place a pillow or a rolled-up towel firmly against the incision when you cough. This can help to reduce pain while taking deep breaths and coughing. General tips When you are able to get out of bed: Walk around often. Continue to take deep breaths and cough in order to clear your lungs. Keep using the incentive spirometer until your health care provider says it is okay to stop using it. If you have been in the hospital, you may be told to keep using the spirometer at home. Contact a health care provider if: You are having difficulty using the spirometer. You have trouble using the spirometer as often as  instructed. Your pain medicine is not giving enough relief for you to use the spirometer as told. You have a fever. Get help right away if: You develop shortness of breath. You develop a cough with bloody mucus from the lungs. You have fluid or blood coming from an incision site after you cough. Summary An incentive spirometer is a tool that can help you learn to take long, deep breaths to keep your lungs clear and active. You may be asked to use a spirometer after a surgery, if you have a lung problem or a history of smoking, or if you have been inactive for a long period of time. Use your incentive spirometer as instructed every 1-2 hours while you are awake. If you have an incision on your chest or abdomen, place a pillow or a rolled-up towel firmly against your incision when you cough. This will help to reduce pain. Get help right away if you have shortness of breath, you cough up bloody mucus, or blood comes from your incision when you cough. This information is not intended to replace advice given to you by your health care provider. Make sure you discuss any questions you have with your health care provider. Document Revised: 08/03/2019 Document Reviewed: 08/03/2019 Elsevier Patient Education  2023 [employer].

## 2023-02-18 ENCOUNTER — Other Ambulatory Visit: Payer: Self-pay

## 2023-02-18 ENCOUNTER — Ambulatory Visit: Payer: Medicare Other

## 2023-02-18 ENCOUNTER — Ambulatory Visit: Payer: Medicare Other | Admitting: Urgent Care

## 2023-02-18 ENCOUNTER — Observation Stay
Admission: RE | Admit: 2023-02-18 | Discharge: 2023-02-19 | Disposition: A | Discharge status: Home / Self Care | Payer: Medicare Other | Source: Ambulatory Visit | Attending: Orthopedic Surgery | Admitting: Orthopedic Surgery

## 2023-02-18 ENCOUNTER — Encounter: Payer: Self-pay | Admitting: Orthopedic Surgery

## 2023-02-18 ENCOUNTER — Encounter
Admission: RE | Disposition: A | Discharge status: Home / Self Care | Payer: Self-pay | Source: Ambulatory Visit | Attending: Orthopedic Surgery

## 2023-02-18 DIAGNOSIS — M1612 Unilateral primary osteoarthritis, left hip: Principal | ICD-10-CM | POA: Insufficient documentation

## 2023-02-18 DIAGNOSIS — Z96652 Presence of left artificial knee joint: Secondary | ICD-10-CM | POA: Insufficient documentation

## 2023-02-18 DIAGNOSIS — Z79899 Other long term (current) drug therapy: Secondary | ICD-10-CM | POA: Insufficient documentation

## 2023-02-18 DIAGNOSIS — Z7982 Long term (current) use of aspirin: Secondary | ICD-10-CM | POA: Diagnosis not present

## 2023-02-18 DIAGNOSIS — E039 Hypothyroidism, unspecified: Secondary | ICD-10-CM | POA: Diagnosis not present

## 2023-02-18 DIAGNOSIS — Z01812 Encounter for preprocedural laboratory examination: Principal | ICD-10-CM

## 2023-02-18 DIAGNOSIS — I10 Essential (primary) hypertension: Secondary | ICD-10-CM | POA: Diagnosis not present

## 2023-02-18 DIAGNOSIS — Z96642 Presence of left artificial hip joint: Secondary | ICD-10-CM | POA: Diagnosis present

## 2023-02-18 HISTORY — PX: TOTAL HIP ARTHROPLASTY: SHX124

## 2023-02-18 SURGERY — ARTHROPLASTY, HIP, TOTAL, ANTERIOR APPROACH
Anesthesia: Spinal | Site: Hip | Laterality: Left

## 2023-02-18 MED ORDER — KETOROLAC TROMETHAMINE 0.5 % OP SOLN
1.0000 [drp] | Freq: Four times a day (QID) | OPHTHALMIC | Status: DC
  Administered 2023-02-19: 1 [drp] via OPHTHALMIC
  Filled 2023-02-18: qty 3

## 2023-02-18 MED ORDER — BUPIVACAINE HCL (PF) 0.5 % IJ SOLN
INTRAMUSCULAR | Status: DC | PRN
  Administered 2023-02-18: 3 mL

## 2023-02-18 MED ORDER — PANTOPRAZOLE SODIUM 40 MG PO TBEC
40.0000 mg | DELAYED_RELEASE_TABLET | Freq: Every day | ORAL | Status: DC
  Administered 2023-02-18 – 2023-02-19 (×2): 40 mg via ORAL
  Filled 2023-02-18 (×2): qty 1

## 2023-02-18 MED ORDER — MENTHOL 3 MG MT LOZG: 1.0000 | LOZENGE | OROMUCOSAL | Status: DC | PRN

## 2023-02-18 MED ORDER — ACETAMINOPHEN 500 MG PO TABS
1000.0000 mg | ORAL_TABLET | Freq: Three times a day (TID) | ORAL | Status: DC
  Administered 2023-02-18 – 2023-02-19 (×2): 1000 mg via ORAL
  Filled 2023-02-18 (×2): qty 2

## 2023-02-18 MED ORDER — DOCUSATE SODIUM 100 MG PO CAPS
100.0000 mg | ORAL_CAPSULE | Freq: Two times a day (BID) | ORAL | Status: DC
  Administered 2023-02-18 – 2023-02-19 (×2): 100 mg via ORAL
  Filled 2023-02-18 (×2): qty 1

## 2023-02-18 MED ORDER — MIDAZOLAM HCL 2 MG/2ML IJ SOLN
INTRAMUSCULAR | Status: AC
  Filled 2023-02-18: qty 2

## 2023-02-18 MED ORDER — LACTATED RINGERS IV SOLN: INTRAVENOUS | Status: DC

## 2023-02-18 MED ORDER — LISINOPRIL 10 MG PO TABS
10.0000 mg | ORAL_TABLET | Freq: Every day | ORAL | Status: DC
  Administered 2023-02-18: 10 mg via ORAL
  Filled 2023-02-18: qty 1

## 2023-02-18 MED ORDER — CHLORHEXIDINE GLUCONATE 0.12 % MT SOLN
OROMUCOSAL | Status: AC
  Filled 2023-02-18: qty 15

## 2023-02-18 MED ORDER — PROPOFOL 1000 MG/100ML IV EMUL
INTRAVENOUS | Status: AC
  Filled 2023-02-18: qty 100

## 2023-02-18 MED ORDER — FAMOTIDINE 20 MG PO TABS
ORAL_TABLET | ORAL | Status: AC
  Filled 2023-02-18: qty 1

## 2023-02-18 MED ORDER — OXYCODONE HCL 5 MG PO TABS: 5.0000 mg | ORAL_TABLET | Freq: Once | ORAL | Status: DC | PRN

## 2023-02-18 MED ORDER — SODIUM CHLORIDE 0.9 % IR SOLN
Status: DC | PRN
  Administered 2023-02-18: 100 mL

## 2023-02-18 MED ORDER — CHLORHEXIDINE GLUCONATE 0.12 % MT SOLN
15.0000 mL | Freq: Once | OROMUCOSAL | Status: AC
  Administered 2023-02-18: 15 mL via OROMUCOSAL

## 2023-02-18 MED ORDER — SODIUM CHLORIDE (PF) 0.9 % IJ SOLN
INTRAMUSCULAR | Status: DC | PRN
  Administered 2023-02-18: 50 mL

## 2023-02-18 MED ORDER — TRANEXAMIC ACID-NACL 1000-0.7 MG/100ML-% IV SOLN
INTRAVENOUS | Status: AC
  Filled 2023-02-18: qty 100

## 2023-02-18 MED ORDER — CEFAZOLIN SODIUM-DEXTROSE 2-4 GM/100ML-% IV SOLN
INTRAVENOUS | Status: AC
  Filled 2023-02-18: qty 100

## 2023-02-18 MED ORDER — FENTANYL CITRATE (PF) 100 MCG/2ML IJ SOLN
INTRAMUSCULAR | Status: AC
  Filled 2023-02-18: qty 2

## 2023-02-18 MED ORDER — ONDANSETRON HCL 4 MG PO TABS: 4.0000 mg | ORAL_TABLET | Freq: Four times a day (QID) | ORAL | Status: DC | PRN

## 2023-02-18 MED ORDER — BUPIVACAINE LIPOSOME 1.3 % IJ SUSP
INTRAMUSCULAR | Status: AC
  Filled 2023-02-18: qty 20

## 2023-02-18 MED ORDER — PROPOFOL 10 MG/ML IV BOLUS
INTRAVENOUS | Status: DC | PRN
  Administered 2023-02-18: 20 mg via INTRAVENOUS

## 2023-02-18 MED ORDER — PHENYLEPHRINE HCL-NACL 20-0.9 MG/250ML-% IV SOLN
INTRAVENOUS | Status: DC | PRN
  Administered 2023-02-18: 30 ug/min via INTRAVENOUS

## 2023-02-18 MED ORDER — PHENOL 1.4 % MT LIQD: 1.0000 | OROMUCOSAL | Status: DC | PRN

## 2023-02-18 MED ORDER — OXYCODONE HCL 5 MG/5ML PO SOLN: 5.0000 mg | Freq: Once | ORAL | Status: DC | PRN

## 2023-02-18 MED ORDER — ONDANSETRON HCL 4 MG/2ML IJ SOLN
INTRAMUSCULAR | Status: DC | PRN
  Administered 2023-02-18: 4 mg via INTRAVENOUS

## 2023-02-18 MED ORDER — EPHEDRINE SULFATE (PRESSORS) 50 MG/ML IJ SOLN
INTRAMUSCULAR | Status: DC | PRN
  Administered 2023-02-18 (×4): 5 mg via INTRAVENOUS
  Administered 2023-02-18: 10 mg via INTRAVENOUS
  Administered 2023-02-18: 5 mg via INTRAVENOUS

## 2023-02-18 MED ORDER — PHENYLEPHRINE HCL (PRESSORS) 10 MG/ML IV SOLN
INTRAVENOUS | Status: DC | PRN
  Administered 2023-02-18 (×3): 80 ug via INTRAVENOUS
  Administered 2023-02-18 (×2): 120 ug via INTRAVENOUS
  Administered 2023-02-18: 160 ug via INTRAVENOUS

## 2023-02-18 MED ORDER — CEFAZOLIN SODIUM-DEXTROSE 2-4 GM/100ML-% IV SOLN
2.0000 g | INTRAVENOUS | Status: AC
  Administered 2023-02-18: 2 g via INTRAVENOUS
  Filled 2023-02-18 (×2): qty 100

## 2023-02-18 MED ORDER — PHENYLEPHRINE HCL-NACL 20-0.9 MG/250ML-% IV SOLN
INTRAVENOUS | Status: AC
  Filled 2023-02-18: qty 250

## 2023-02-18 MED ORDER — SURGIPHOR WOUND IRRIGATION SYSTEM - OPTIME: TOPICAL | Status: DC | PRN

## 2023-02-18 MED ORDER — BUPIVACAINE-EPINEPHRINE (PF) 0.25% -1:200000 IJ SOLN
INTRAMUSCULAR | Status: AC
  Filled 2023-02-18: qty 30

## 2023-02-18 MED ORDER — METOCLOPRAMIDE HCL 5 MG PO TABS: 5.0000 mg | ORAL_TABLET | Freq: Three times a day (TID) | ORAL | Status: DC | PRN

## 2023-02-18 MED ORDER — MIDAZOLAM HCL 5 MG/5ML IJ SOLN
INTRAMUSCULAR | Status: DC | PRN
  Administered 2023-02-18: 2 mg via INTRAVENOUS

## 2023-02-18 MED ORDER — ACETAMINOPHEN 10 MG/ML IV SOLN
INTRAVENOUS | Status: DC | PRN
  Administered 2023-02-18: 1000 mg via INTRAVENOUS

## 2023-02-18 MED ORDER — SODIUM CHLORIDE 0.9 % IV SOLN: INTRAVENOUS | Status: DC

## 2023-02-18 MED ORDER — TRANEXAMIC ACID-NACL 1000-0.7 MG/100ML-% IV SOLN
1000.0000 mg | INTRAVENOUS | Status: AC
  Administered 2023-02-18 (×2): 1000 mg via INTRAVENOUS

## 2023-02-18 MED ORDER — SODIUM CHLORIDE FLUSH 0.9 % IV SOLN
INTRAVENOUS | Status: AC
  Filled 2023-02-18: qty 10

## 2023-02-18 MED ORDER — DEXAMETHASONE SODIUM PHOSPHATE 10 MG/ML IJ SOLN
8.0000 mg | Freq: Once | INTRAMUSCULAR | Status: AC
  Administered 2023-02-18: 8 mg via INTRAVENOUS

## 2023-02-18 MED ORDER — ASPIRIN 325 MG PO TABS
325.0000 mg | ORAL_TABLET | Freq: Every day | ORAL | Status: DC
  Administered 2023-02-19: 325 mg via ORAL
  Filled 2023-02-18: qty 1

## 2023-02-18 MED ORDER — LEVOTHYROXINE SODIUM 88 MCG PO TABS
88.0000 ug | ORAL_TABLET | Freq: Every day | ORAL | Status: DC
  Administered 2023-02-19: 88 ug via ORAL
  Filled 2023-02-18: qty 1

## 2023-02-18 MED ORDER — KETOROLAC TROMETHAMINE 15 MG/ML IJ SOLN
INTRAMUSCULAR | Status: AC
  Filled 2023-02-18: qty 1

## 2023-02-18 MED ORDER — ORAL CARE MOUTH RINSE: 15.0000 mL | Freq: Once | OROMUCOSAL | Status: AC

## 2023-02-18 MED ORDER — ONDANSETRON HCL 4 MG/2ML IJ SOLN: 4.0000 mg | Freq: Four times a day (QID) | INTRAMUSCULAR | Status: DC | PRN

## 2023-02-18 MED ORDER — ENOXAPARIN SODIUM 40 MG/0.4ML IJ SOSY
40.0000 mg | PREFILLED_SYRINGE | INTRAMUSCULAR | Status: DC
  Administered 2023-02-19: 40 mg via SUBCUTANEOUS
  Filled 2023-02-18: qty 0.4

## 2023-02-18 MED ORDER — METOCLOPRAMIDE HCL 5 MG/ML IJ SOLN: 5.0000 mg | Freq: Three times a day (TID) | INTRAMUSCULAR | Status: DC | PRN

## 2023-02-18 MED ORDER — CEFAZOLIN SODIUM-DEXTROSE 2-4 GM/100ML-% IV SOLN
2.0000 g | Freq: Four times a day (QID) | INTRAVENOUS | Status: AC
  Administered 2023-02-18 (×2): 2 g via INTRAVENOUS
  Filled 2023-02-18 (×2): qty 100

## 2023-02-18 MED ORDER — PROPOFOL 500 MG/50ML IV EMUL
INTRAVENOUS | Status: DC | PRN
  Administered 2023-02-18: 75 ug/kg/min via INTRAVENOUS

## 2023-02-18 MED ORDER — TRAMADOL HCL 50 MG PO TABS: 50.0000 mg | ORAL_TABLET | Freq: Four times a day (QID) | ORAL | Status: DC | PRN

## 2023-02-18 MED ORDER — HYDROCODONE-ACETAMINOPHEN 5-325 MG PO TABS: 1.0000 | ORAL_TABLET | ORAL | Status: DC | PRN

## 2023-02-18 MED ORDER — BUPIVACAINE HCL (PF) 0.5 % IJ SOLN
INTRAMUSCULAR | Status: AC
  Filled 2023-02-18: qty 10

## 2023-02-18 MED ORDER — FENTANYL CITRATE (PF) 100 MCG/2ML IJ SOLN
25.0000 ug | INTRAMUSCULAR | Status: DC | PRN
  Administered 2023-02-18: 25 ug via INTRAVENOUS

## 2023-02-18 MED ORDER — MORPHINE SULFATE (PF) 2 MG/ML IV SOLN: 0.5000 mg | INTRAVENOUS | Status: DC | PRN

## 2023-02-18 MED ORDER — FAMOTIDINE 20 MG PO TABS
20.0000 mg | ORAL_TABLET | Freq: Once | ORAL | Status: AC
  Administered 2023-02-18: 20 mg via ORAL

## 2023-02-18 MED ORDER — GLYCOPYRROLATE 0.2 MG/ML IJ SOLN
INTRAMUSCULAR | Status: DC | PRN
Start: 2023-02-18 — End: 2023-02-18
  Administered 2023-02-18: .2 mg via INTRAVENOUS

## 2023-02-18 MED ORDER — ATORVASTATIN CALCIUM 10 MG PO TABS
10.0000 mg | ORAL_TABLET | Freq: Every day | ORAL | Status: DC
  Administered 2023-02-18: 10 mg via ORAL
  Filled 2023-02-18: qty 1

## 2023-02-18 MED ORDER — 0.9 % SODIUM CHLORIDE (POUR BTL) OPTIME
TOPICAL | Status: DC | PRN
  Administered 2023-02-18: 500 mL

## 2023-02-18 MED ORDER — KETOROLAC TROMETHAMINE 15 MG/ML IJ SOLN
7.5000 mg | Freq: Four times a day (QID) | INTRAMUSCULAR | Status: AC
  Administered 2023-02-18 – 2023-02-19 (×4): 7.5 mg via INTRAVENOUS
  Filled 2023-02-18 (×3): qty 1

## 2023-02-18 SURGICAL SUPPLY — 70 items
ADH SKN CLS APL DERMABOND .7 (GAUZE/BANDAGES/DRESSINGS) ×1
AGENT HMST KT MTR STRL THRMB (HEMOSTASIS)
APL PRP STRL LF DISP 70% ISPRP (MISCELLANEOUS) ×2
BLADE SAGITTAL AGGR TOOTH XLG (BLADE) ×1 IMPLANT
BNDG CMPR 5X6 CHSV STRCH STRL (GAUZE/BANDAGES/DRESSINGS) ×2
BNDG COHESIVE 6X5 TAN ST LF (GAUZE/BANDAGES/DRESSINGS) ×2 IMPLANT
CHLORAPREP W/TINT 26 (MISCELLANEOUS) ×1 IMPLANT
DERMABOND ADVANCED .7 DNX12 (GAUZE/BANDAGES/DRESSINGS) ×1 IMPLANT
DRAPE C-ARM XRAY 36X54 (DRAPES) ×1 IMPLANT
DRAPE POUCH INSTRU U-SHP 10X18 (DRAPES) ×1 IMPLANT
DRAPE SHEET LG 3/4 BI-LAMINATE (DRAPES) ×3 IMPLANT
DRAPE TABLE BACK 80X90 (DRAPES) ×1 IMPLANT
DRSG MEPILEX SACRM 8.7X9.8 (GAUZE/BANDAGES/DRESSINGS) ×1 IMPLANT
DRSG OPSITE POSTOP 4X8 (GAUZE/BANDAGES/DRESSINGS) ×1 IMPLANT
ELECT BLADE 4.0 EZ CLEAN MEGAD (MISCELLANEOUS) ×1
ELECT REM PT RETURN 9FT ADLT (ELECTROSURGICAL) ×1
ELECTRODE BLDE 4.0 EZ CLN MEGD (MISCELLANEOUS) ×1 IMPLANT
ELECTRODE REM PT RTRN 9FT ADLT (ELECTROSURGICAL) ×1 IMPLANT
GLOVE BIO SURGEON STRL SZ8 (GLOVE) ×1 IMPLANT
GLOVE BIOGEL PI IND STRL 8 (GLOVE) ×1 IMPLANT
GLOVE PI ORTHO PRO STRL 7.5 (GLOVE) ×2 IMPLANT
GLOVE PI ORTHO PRO STRL SZ8 (GLOVE) ×2 IMPLANT
GLOVE SURG SYN 7.5 E (GLOVE) ×1 IMPLANT
GLOVE SURG SYN 7.5 PF PI (GLOVE) ×1 IMPLANT
GOWN SRG XL LVL 3 NONREINFORCE (GOWNS) ×1 IMPLANT
GOWN STRL NON-REIN TWL XL LVL3 (GOWNS) ×1
GOWN STRL REUS W/ TWL LRG LVL3 (GOWN DISPOSABLE) ×1 IMPLANT
GOWN STRL REUS W/ TWL XL LVL3 (GOWN DISPOSABLE) ×1 IMPLANT
GOWN STRL REUS W/TWL LRG LVL3 (GOWN DISPOSABLE) ×1
GOWN STRL REUS W/TWL XL LVL3 (GOWN DISPOSABLE) ×1
HANDLE YANKAUER SUCT OPEN TIP (MISCELLANEOUS) ×1 IMPLANT
HEAD CERAMIC FEMORAL 36MM (Head) IMPLANT
HOOD PEEL AWAY T7 (MISCELLANEOUS) ×2 IMPLANT
INSERT TRIDENT POLY 36 0DEG (Insert) IMPLANT
IV NS 100ML SINGLE PACK (IV SOLUTION) ×1 IMPLANT
KIT PATIENT CARE HANA TABLE (KITS) ×1 IMPLANT
LIGHT WAVEGUIDE WIDE FLAT (MISCELLANEOUS) ×1 IMPLANT
MANIFOLD NEPTUNE II (INSTRUMENTS) ×1 IMPLANT
MARKER SKIN DUAL TIP RULER LAB (MISCELLANEOUS) ×1 IMPLANT
MAT ABSORB FLUID 56X50 GRAY (MISCELLANEOUS) ×1 IMPLANT
NDL FILTER BLUNT 18X1 1/2 (NEEDLE) ×1 IMPLANT
NDL SAFETY ECLIP 18X1.5 (MISCELLANEOUS) ×1 IMPLANT
NDL SPNL 20GX3.5 QUINCKE YW (NEEDLE) ×1 IMPLANT
NEEDLE FILTER BLUNT 18X1 1/2 (NEEDLE) ×1 IMPLANT
NEEDLE SPNL 20GX3.5 QUINCKE YW (NEEDLE) ×1 IMPLANT
NS IRRIG 500ML POUR BTL (IV SOLUTION) ×1 IMPLANT
PACK HIP COMPR (MISCELLANEOUS) ×1 IMPLANT
PAD ARMBOARD 7.5X6 YLW CONV (MISCELLANEOUS) ×1 IMPLANT
SCREW HEX LP 6.5X20 (Screw) IMPLANT
SCREW HEX LP 6.5X35 (Screw) IMPLANT
SHELL CLUSTERHOLE ACETABULAR 5 (Shell) IMPLANT
SLEEVE SCD COMPRESS KNEE MED (STOCKING) ×1 IMPLANT
SOLUTION IRRIG SURGIPHOR (IV SOLUTION) ×1 IMPLANT
STEM HIGH OFFSET SZ6 38.5X107 (Stem) IMPLANT
SURGIFLO W/THROMBIN 8M KIT (HEMOSTASIS) IMPLANT
SUT BONE WAX W31G (SUTURE) ×1 IMPLANT
SUT DVC 2 QUILL PDO T11 36X36 (SUTURE) ×1 IMPLANT
SUT ETHIBOND 2 V 37 (SUTURE) ×1 IMPLANT
SUT QUILL MONODERM 3-0 PS-2 (SUTURE) ×1 IMPLANT
SUT SILK 0 (SUTURE) ×1
SUT SILK 0 30XBRD TIE 6 (SUTURE) ×1 IMPLANT
SUT VIC AB 0 CT1 36 (SUTURE) ×1 IMPLANT
SUT VIC AB 2-0 CT2 27 (SUTURE) ×2 IMPLANT
SYR 30ML LL (SYRINGE) ×2 IMPLANT
SYR TB 1ML LL NO SAFETY (SYRINGE) ×1 IMPLANT
TAPE MICROFOAM 4IN (TAPE) IMPLANT
TOWEL OR 17X26 4PK STRL BLUE (TOWEL DISPOSABLE) IMPLANT
TRAP FLUID SMOKE EVACUATOR (MISCELLANEOUS) ×1 IMPLANT
WAND WEREWOLF FASTSEAL 6.0 (MISCELLANEOUS) ×1 IMPLANT
WATER STERILE IRR 1000ML POUR (IV SOLUTION) ×1 IMPLANT

## 2023-02-18 NOTE — Progress Notes (Signed)
PT Cancellation Note  Patient Details Name: Curtis Carroll MRN: [id number] DOB: [birth year]   Cancelled Treatment:    Reason Eval/Treat Not Completed: Patient not medically ready. Pt has not been cleared by RN at this time to begin PT. Will re-attempt PT evaluation at a later time.   Maryjayne Kleven 02/18/2023, 3:21 PM

## 2023-02-18 NOTE — Discharge Instructions (Signed)
Instructions after Anterior Total Hip Replacement        Dr. Serita Butcher., M.D.      Dept. of [facility]  [place], [place]  [address]  Phone: [phone]   Fax: [phone]    DIET: Drink plenty of non-alcoholic fluids. Resume your normal diet. Include foods high in fiber.  ACTIVITY:  You may use crutches or a walker with weight-bearing as tolerated, unless instructed otherwise. You may be weaned off of the walker or crutches by your Physical Therapist.  Continue doing gentle exercises. Exercising will reduce the pain and swelling, increase motion, and prevent muscle weakness.   Please continue to use the TED compression stockings for 2 weeks. You may remove the stockings at night, but should reapply them in the morning. Do not drive or operate any equipment until instructed.  WOUND CARE:  Continue to use ice packs periodically to reduce pain and swelling. You may shower with honeycomb dressing 3 days after your surgery. Do not submerge incision site under water. Remove honeycomb dressing 7 days after surgery and allow dermabond to fall off on its own.   MEDICATIONS: You may resume your regular medications. Please take the pain medication as prescribed on the medication list. Do not take pain medication on an empty stomach. You have been given a prescription for a blood thinner to prevent blood clots. Please take the medication as instructed. (NOTE: After completing a 2 week course of Lovenox, take one Enteric-coated 81 mg aspirin twice a day for 3 additional weeks.) Pain medications and iron supplements can cause constipation. Use a stool softener (Senokot or Colace) on a daily basis and a laxative (dulcolax or miralax) as needed. Do not drive or drink alcoholic beverages when taking pain medications.  POSTOPERATIVE CONSTIPATION PROTOCOL Constipation - defined medically as fewer than three stools per week and  severe constipation as less than one stool per week.  One of the most common issues patients have following surgery is constipation.  Even if you have a regular bowel pattern at home, your normal regimen is likely to be disrupted due to multiple reasons following surgery.  Combination of anesthesia, postoperative narcotics, change in appetite and fluid intake all can affect your bowels.  In order to avoid complications following surgery, here are some recommendations in order to help you during your recovery period.  Colace (docusate) - Pick up an over-the-counter form of Colace or another stool softener and take twice a day as long as you are requiring postoperative pain medications.  Take with a full glass of water daily.  If you experience loose stools or diarrhea, hold the colace until you stool forms back up.  If your symptoms do not get better within 1 week or if they get worse, check with your doctor.  Dulcolax (bisacodyl) - Pick up over-the-counter and take as directed by the product packaging as needed to assist with the movement of your bowels.  Take with a full glass of water.  Use this product as needed if not relieved by Colace only.   MiraLax (polyethylene glycol) - Pick up over-the-counter to have on hand.  MiraLax is a solution that will increase the amount of water in your bowels to assist with bowel movements.  Take as directed and can mix with a glass of water, juice, soda, coffee, or tea.  Take if you go more than two days without a movement. Do not use MiraLax more than  once per day. Call your doctor if you are still constipated or irregular after using this medication for 7 days in a row.  If you continue to have problems with postoperative constipation, please contact the office for further assistance and recommendations.  If you experience "the worst abdominal pain ever" or develop nausea or vomiting, please contact the office immediatly for further recommendations for  treatment.   CALL THE OFFICE FOR: Temperature above 101 degrees Excessive bleeding or drainage on the dressing. Excessive swelling, coldness, or paleness of the toes. Persistent nausea and vomiting.  FOLLOW-UP:  You should have an appointment to return to the office in 2 weeks after surgery. Arrangements have been made for continuation of Physical Therapy (either home therapy or outpatient therapy).

## 2023-02-18 NOTE — Anesthesia Preprocedure Evaluation (Signed)
Anesthesia Evaluation  Patient identified by MRN, date of birth, ID band Patient awake    Reviewed: Allergy & Precautions, H&P , NPO status , Patient's Chart, lab work & pertinent test results, reviewed documented beta blocker date and time   Airway Mallampati: II   Neck ROM: full    Dental  (+) Poor Dentition, Dental Advidsory Given   Pulmonary neg pulmonary ROS, former smoker   Pulmonary exam normal        Cardiovascular Exercise Tolerance: Good hypertension, On Medications negative cardio ROS Normal cardiovascular exam Rhythm:regular Rate:Normal     Neuro/Psych negative neurological ROS  negative psych ROS   GI/Hepatic negative GI ROS, Neg liver ROS,,,  Endo/Other  Hypothyroidism    Renal/GU negative Renal ROS  negative genitourinary   Musculoskeletal   Abdominal   Peds  Hematology negative hematology ROS (+)   Anesthesia Other Findings Past Medical History: No date: Arthritis 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 No date: Celiac disease     Comment:  allergy to wheat grain No date: Chronic ankle pain No date: Colon polyp No date: Enthesopathy of ankle/tarsus No date: Family history of brain cancer No date: Family history of cancer No date: Family history of lung cancer No date: History of colon polyps No date: Hyperlipidemia No date: Hypertension No date: Polio Past Surgical History: No date: ANKLE ARTHROCENTESIS; Right 03/25/2017: COLONOSCOPY WITH PROPOFOL; N/A     Comment:  Procedure: COLONOSCOPY WITH PROPOFOL;  Surgeon:               Christena Deem, MD;  Location: [facility];                Service: Endoscopy;  Laterality: N/A; 01/01/2019: COLONOSCOPY WITH PROPOFOL; N/A     Comment:  Procedure: COLONOSCOPY WITH PROPOFOL;  Surgeon:               Christena Deem, MD;  Location: [facility];                Service: Endoscopy;  Laterality: N/A; 11/06/2019: ESOPHAGOGASTRODUODENOSCOPY  (EGD) WITH PROPOFOL; N/A     Comment:  Procedure: ESOPHAGOGASTRODUODENOSCOPY (EGD) WITH               PROPOFOL;  Surgeon: Earline Mayotte, MD;  Location:               [facility];  Service: Endoscopy;  Laterality: N/A; No date: KNEE ARTHROSCOPY; Left No date: pins in toes; Right No date: SHOULDER ARTHROSCOPY; Right 09/15/2015: TOTAL KNEE ARTHROPLASTY; Left     Comment:  Procedure: TOTAL KNEE ARTHROPLASTY;  Surgeon: Kennedy Bucker, MD;  Location: [facility] ORS;  Service: Orthopedics;                Laterality: Left; BMI    Body Mass Index: 29.27 kg/m     Reproductive/Obstetrics negative OB ROS                             Anesthesia Physical Anesthesia Plan  ASA: 3  Anesthesia Plan: Spinal   Post-op Pain Management:    Induction:   PONV Risk Score and Plan: 2 and Ondansetron, Midazolam and Treatment may vary due to age or medical condition  Airway Management Planned: Natural Airway and Nasal Cannula  Additional Equipment:   Intra-op Plan:   Post-operative Plan:   Informed Consent: I  have reviewed the patients History and Physical, chart, labs and discussed the procedure including the risks, benefits and alternatives for the proposed anesthesia with the patient or authorized representative who has indicated his/her understanding and acceptance.     Dental Advisory Given  Plan Discussed with: CRNA  Anesthesia Plan Comments: (Patient reports no bleeding problems and no anticoagulant use.  Plan for spinal with backup GA  Patient consented for risks of anesthesia including but not limited to:  - adverse reactions to medications - damage to eyes, teeth, lips or other oral mucosa - nerve damage due to positioning  - risk of bleeding, infection and or nerve damage from spinal that could lead to paralysis - risk of headache or failed spinal - damage to teeth, lips or other oral mucosa - sore throat or hoarseness - damage to heart,  brain, nerves, lungs, other parts of body or loss of life  Patient voiced understanding.)        Anesthesia Quick Evaluation
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
## 2023-02-18 NOTE — Op Note (Signed)
Patient Name: Curtis Carroll  VWU:[id number]  Pre-Operative Diagnosis: Left hip Osteoarthritis  Post-Operative Diagnosis: (same)  Procedure: Left Total Hip Arthroplasty  Components/Implants: Cup: Trident Tritanium Clusterhole 27mm/E w/ x2 screws    Liner: Neutral X3 36/E  Stem: Insignia #6 high offset  Head:52mm +74mm biolox ceramic  Date of Surgery: 02/18/2023  Surgeon: Reinaldo Berber MD  Assistant: Patience RNFA (present and scrubbed throughout the case, critical for assistance with exposure, retraction, instrumentation, and closure)   Anesthesiologist: Lorette Ang  Anesthesia: Spinal   EBL: 150cc  IVF:1000cc  Complications: None   Brief history: The patient is a 77 year old male with a history of osteoarthritis of the left hip with pain limiting their range of motion and activities of daily living, which has failed multiple attempts at conservative therapy.  The risks and benefits of total hip arthroplasty as definitive surgical treatment were discussed with the patient, who opted to proceed with the operation.  After outpatient medical clearance and optimization was completed the patient was admitted to [facility] for the procedure.  All preoperative films were reviewed and an appropriate surgical plan was made prior to surgery.   Description of procedure: The patient was brought to the operating room where laterality was confirmed by all those present to be the left side.  The patient was administered spinal anesthesia on a stretcher prior to being moved supine on the operating room table. Patient was given an intravenous dose of antibiotics for surgical prophylaxis and TXA.  All bony prominences and extremities were well padded and the patient was securely attached to the table boots, a perineal post was placed and the patient had a safety strap placed.  Surgical site was prepped with alcohol and chlorhexidine. The surgical site over the hip was and draped in  typical sterile fashion with multiple layers of adhesive and nonadhesive drapes.  The incision site was marked out with a sterile marker and care was taken to assess the position of the ASIS and ensure appropriate position for the incision.    A surgical timeout was then called with participation of all staff in the room the patient was then a confirmed again and laterality confirmed.  Incision was made over the anterior lateral aspect of the proximal thigh in line with the TFL.  Appropriate retractors were placed and all bleeding vessels were coagulated within the subcutaneous and fatty layers.  An incision was made in the TFL fascia in the interval was carefully identified.  The lateral ascending branches of the circumflex vessels were identified, cauterized and carefully dissected. The main vessels were then tied with a 0 silk hand tie.  Retractors were placed around the superior lateral and inferior medial aspects of the femoral neck and a capsulotomy was performed exposing the hip joint.  Retraction stitches were placed and the capsulotomy to assist with visualization.  Femoral neck cut was then made and the femoral head was extracted after placing the leg in traction.  Bone wax was then applied to the proximal cut surface of the femur and aqua mantis was used to address any bleeding around the femoral neck cut.  Retractors were then placed around the acetabulum to fully visualize the joint space, and the remaining labral tissue was removed and pulvinar was removed.   The acetabulum was then sequentially reamed up to the appropriate size in order to get good fit and fill for the acetabular component while under fluoroscopic guidance.  Acetabular component was then placed and malleted into a secure  fit while confirming position and abduction angle and anteversion utilizing fluoroscopy.  2 screws were then placed in the acetabular cup to assist in securing the cup in place. The cup was irrigated,  a real  neutral liner was placed, impacted, and checked for stability. The femur traction was dropped and sequentially externally rotated while performing a release of the posterior and superomedial tissues off of the proximal femur to allow for mobility, care was taken to preserve the external rotators and piriformis attachments.  The remaining interval between the abductors and the capsule was dissected out and a retractor was placed over the superolateral aspect of the femur over the greater trochanter.  The leg was carefully brought down into extension and adducted to provide visualization of the proximal femur for broaching.  The femur was then sequentially broached up to an appropriate size which provided for good fill and stability to the femoral broach.  A trial neck and head were placed on the femoral broach and the leg was brought up for reduction.  The hip was reduced and manual check of stability was performed.  The hip was found to be stable in flexion internal rotation and extension external rotation.  Leg lengths were confirmed on fluoroscopy.   The hip was then dislocated the trial neck and head were removed.  The leg was then brought down into extension and adduction in the proximal femur was reexposed.  The broach trial was removed and the femur was irrigated with normal saline prior to the real femoral stem being implanted.  After the femoral stem was seated and shown to have good fit and fill the appropriate head was impacted the leg was brought up and reduced.  There was good range of motion with stability in flexion internal rotation and extension external rotation on testing.  Leg lengths were found to be appropriate on fluoroscopic evaluation at this time.  The hip was then irrigated with betdine based surgiphor solution and then saline solution.  The capsulotomy was repaired with Ethibond sutures.  A pericapsular and peritrochanteric cocktail with Exparel and bupivacaine was then injected as well  as the subcutaneous tissues. The fascia was closed with a #2 barbed running suture.  The deep tissues were closed with Vicryl sutures the subcutaneous tissues were closed with interrupted Vicryl sutures and a running barbed 3-0 suture.  The skin was then reinforced with Dermabond and a sterile dressing was placed.   The patient was awoken from anesthesia transferred off of the operating room table onto a hospital bed where examination of leg lengths found the leg lengths to be equal with a good distal pulse.  The patient was then transferred to the PACU in stable condition.

## 2023-02-18 NOTE — Anesthesia Procedure Notes (Signed)
Date/Time: 02/18/2023 11:38 AM  Performed by: Elmarie Mainland, CRNAOxygen Delivery Method: Supernova nasal CPAP

## 2023-02-18 NOTE — Transfer of Care (Signed)
Immediate Anesthesia Transfer of Care Note  Patient: Curtis Carroll  Procedure(s) Performed: TOTAL HIP ARTHROPLASTY ANTERIOR APPROACH (Left: Hip)  Patient Location: PACU  Anesthesia Type:MAC and Spinal  Level of Consciousness: responds to stimulation  Airway & Oxygen Therapy: Patient Spontanous Breathing and Patient connected to face mask oxygen  Post-op Assessment: Report given to RN and Post -op Vital signs reviewed and stable  Post vital signs: Reviewed and stable  Last Vitals:  Vitals Value Taken Time  BP 119/66 02/18/23 1306  Temp    Pulse 64 02/18/23 1308  Resp 18 02/18/23 1308  SpO2 100 % 02/18/23 1308  Vitals shown include unfiled device data.  Last Pain:  Vitals:   02/18/23 0905  TempSrc: Tympanic  PainSc: 4          Complications: No notable events documented.

## 2023-02-18 NOTE — Anesthesia Procedure Notes (Addendum)
Spinal  Patient location during procedure: OR Start time: 02/18/2023 10:55 AM End time: 02/18/2023 10:58 AM Reason for block: surgical anesthesia Staffing Performed: resident/CRNA  Anesthesiologist: Stephanie Coup, MD Resident/CRNA: Elmarie Mainland, CRNA Other anesthesia staff: Carolynne Edouard, RN Performed by: Carolynne Edouard, RN Authorized by: Stephanie Coup, MD   Preanesthetic Checklist Completed: patient identified, IV checked, site marked, risks and benefits discussed, surgical consent, monitors and equipment checked, pre-op evaluation and timeout performed Spinal Block Patient position: sitting Prep: DuraPrep Patient monitoring: heart rate, cardiac monitor, continuous pulse ox and blood pressure Approach: midline Location: L3-4 Injection technique: single-shot Needle Needle type: Pencan and Introducer  Needle gauge: 24 G Needle length: 9 cm Assessment Sensory level: T10 Events: CSF return

## 2023-02-18 NOTE — Plan of Care (Signed)

## 2023-02-18 NOTE — H&P (Signed)
History of Present Illness: Curtis Carroll is an 77 y.o. male who presents for evaluation of his left hip. Patient reports he has had pain in his left groin which has been going on for about 6 weeks now with a history of some achiness which is now more sharp pain. He reports a sharp twinge in his left groin with weightbearing or if he moves his left leg internally. This first occurred while swimming but he is now having issues with ambulation getting up and down out of a chair or walking up and down stairs. He was seen in urgent care and underwent hip x-rays which showed moderate arthritis and then underwent an MRI and is here today to review treatment options. 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 He uses a shoe lift in his right shoe which is currently a three-quarter inch lift. He reports that his left hip is been functionally limiting him despite treatment with anti-inflammatory medications and prednisone. The patient denies fevers, chills, numbness, tingling, shortness of breath, chest pain, recent illness, or any other trauma.  Past Medical History: Past Medical History:  Diagnosis Date  Colon polyp  Hyperlipidemia  Hypertension  Polio ([facility]-HCC)  Thyroid disease   Past Surgical History: Past Surgical History:  Procedure Laterality Date  ARTHROPLASTY TOTAL KNEE Left 09/15/2015  Dr.menz  COLONOSCOPY 03/25/2017  Tubulovillous adenoma of the colon/Hyperplastic colon polyp 18 MONTHS MUS  COLONOSCOPY 01/01/2019  Tubular adenoma of the colon/Tubulovillous adenoma/serrated polyp/Repeat 20yrs/MUS  EGD 11/06/2019  Gastritis/No Repeat/JWB  Colon @ [facility] 11/13/2021  Tubular adenoma/Colitis/PHx CP/Repeat 51yrs/CTL  ANKLE SURGERY Right  FUSION ARTHRODESIS ANKLE W/ARTHROSCOPY  KNEE SURGERY Left   Past Family History: Family History  Problem Relation Age of  Onset  No Known Problems Mother  Heart disease Father  Lung cancer Brother   Medications: Current Outpatient Medications  Medication Sig Dispense Refill  aspirin 325 MG EC tablet Take 650 mg by mouth once daily  aspirin 81 MG EC tablet Take 81 mg by mouth once daily  atorvastatin (LIPITOR) 10 MG tablet Take 10 mg by mouth once daily  cholecalciferol (VITAMIN D3) 1,000 unit capsule Take 5,000 Units by mouth every other day Take every other day  ibuprofen (MOTRIN) 400 MG tablet Take 400 mg by mouth 2 (two) times daily  ketorolac (ACULAR) 0.5 % ophthalmic solution Place 1 drop into the right eye 4 (four) times daily  lisinopril (PRINIVIL,ZESTRIL) 10 MG tablet  SYNTHROID 88 mcg tablet Take 88 mcg by mouth once daily Take on an empty stomach with a glass of water at least 30-60 minutes before breakfast.   No current facility-administered medications for this visit.   Allergies: Allergies  Allergen Reactions  Alcohol Unknown  Black Pepper Diarrhea  Codeine Nausea  Lactulose Other (See Comments)  Bloating  Milk Unknown  Other Diarrhea  Black pepper    Visit Vitals: Vitals:  01/23/23 1352  BP: 120/82    Review of Systems:  A comprehensive 14 point ROS was performed, reviewed, and the pertinent orthopaedic findings are documented in the HPI.  Physical Exam: Body mass index is 29.9 kg/m. General/Constitutional: No apparent distress: well-nourished and well developed. Lymphatic: No palpable adenopathy. Pulmonary exam: Lungs clear to auscultation bilaterally no wheezing rales or rhonchi Cardiac exam: Regular rate and rhythm no obvious murmurs rubs or gallops. Vascular: No edema, swelling or tenderness, except as  noted in detailed exam. Integumentary: No impressive skin lesions present, except as noted in detailed exam. Neuro/Psych: Normal mood and affect, oriented to person, place and time. Musculoskeletal: Normal, except as noted in detailed exam and in HPI.  Left hip  exam  SKIN: intact SWELLING: none WARMTH: no warmth TENDERNESS: none, Stinchfield Positive ROM: 0 degrees internal rotation and 20 degrees external rotation and pain with internal rotation, which localizes to the groin; Hip Flexion 85 STRENGTH: normal GAIT: antalgic and stiff-legged STABILITY: stable to testing CREPITUS: no LEG LENGTH DISCREPANCY: left longer by 1 cm NEUROLOGICAL EXAM: normal VASCULAR EXAM: normal LUMBAR SPINE: tenderness: no straight leg raising sign: no motor exam: normal  The contralateral hip was examined for comparison and it showed: TENDERNESS: none ROM: normal and full STRENGTH: normal STABILITY: stable to testing  Hip Imaging :  AP pelvis and left hip AP and lateral x-rays ordered and taken today in clinic images reviewed by myself. There is redemonstration of moderate to severe left hip degenerative changes with femoral head deformity with lateral flattening cam lesion and osteophyte formation. There is joint space narrowing with subchondral cyst formation and sclerosis. Consistent with prior films. No fractures or dislocations noted.  AP pelvis and left hip AP and lateral x-rays performed on 12/17/2022 at [facility] images and report reviewed by myself. There is moderate to severe left hip degenerative changes with femoral head deformity with lateral flattening and a large cam lesion with osteophyte formation sclerosis and subchondral cyst formation as well as medial and superior joint space narrowing. The partially visualized lumbosacral spine shows degenerative changes with partially visualized scoliotic deformity facet arthropathy sclerosis and disc height loss with osteophytes. The right hip shows mild to moderate degenerative changes with inferior medial joint space narrowing and superior acetabular sclerosis. Agree with radiologist interpretation  MRI of the left hip without contrast performed on 01/02/2023 images and report reviewed by myself. There is  marrow edema within the left femoral head concerning for some subchondral insufficiency injury no evidence of any avascular necrosis. There is high-grade cartilage loss with full-thickness areas along the superior left femoral head and adjacent acetabulum with degenerative tearing of the labrum. Agree with radiologist interpretation.  Assessment:  Left hip osteoarthritis, subchondral insufficiency injury left hip  Plan: Curtis Carroll is a 77 year old male who presents with left hip bone-on-bone arthritis on MRI as well as a subchondral insufficiency fracture on MRI. We discussed treatment options including conservative treatments and the possibility of doing hip injections however the patient would like to avoid any temporizing measures and would like a more permanent solution for his left hip. Given his current symptoms MRI findings and based upon the patient's continued symptoms and failure to respond to conservative treatment, I have recommended a left total hip replacement for this patient. A long discussion took place with the patient describing what a total joint replacement is and what the procedure would entail. A hip model, similar to the implants that will be used during the operation, was utilized to demonstrate the implants. Choices of implant manufactures were discussed and reviewed. The ability to secure the implant utilizing cement or cementless (press fit) fixation was discussed. Anterior and posterior exposures were discussed. For this patient an appropriate approach will be anterior.   The hospitalization and post-operative care and rehabilitation were also discussed. The use of perioperative antibiotics and DVT prophylaxis were discussed. The risk, benefits and alternatives to a surgical intervention were discussed at length with the patient. The patient was also  advised of risks related to the medical comorbidities and elevated body mass index (BMI). A lengthy discussion took place to review the  most common complications including but not limited to: deep vein thrombosis, pulmonary embolus, heart attack, stroke, infection, wound breakdown, heterotopic ossification, dislocation, numbness, leg length in-equality, intraoperative fracture, damage to nerves, tendon,muscles, arteries or other blood vessels, death and other possible complications from anesthesia. The patient was told that we will take steps to minimize these risks by using sterile technique, antibiotics and DVT prophylaxis when appropriate and follow the patient postoperatively in the office setting to monitor progress. The possibility of recurrent pain, no improvement in pain and actual worsening of pain were also discussed with the patient. The risk of dislocation following total hip replacement was discussed and potential precautions to prevent dislocation were reviewed.   The discharge plan of care focused on the patient going home following surgery. The patient was encouraged to make the necessary arrangements to have someone stay with them when they are discharged home.   The benefits of surgery were discussed with the patient including the potential for improving the patient's current clinical condition through operative intervention. Alternatives to surgical intervention including continued conservative management were also discussed in detail. All questions were answered to the satisfaction of the patient. The patient participated and agreed to the plan of care as well as the use of the recommended implants for their total hip replacement surgery. An information packet was given to the patient to review prior to surgery.   The patient received medical clearance for surgery. All questions answered patient agrees with above plan to make preparations for left anterior total replacement.  Portions of this record have been created using Scientist, clinical (histocompatibility and immunogenetics). Dictation errors have been sought, but may not have been identified and  corrected.  Reinaldo Berber MD

## 2023-02-18 NOTE — Interval H&P Note (Signed)
Patient history and physical updated. Consent reviewed including risks, benefits, and alternatives to surgery. Patient agrees with above plan to proceed with left anterior total hip arthroplasty

## 2023-02-19 ENCOUNTER — Encounter: Payer: Self-pay | Admitting: Orthopedic Surgery

## 2023-02-19 DIAGNOSIS — M1612 Unilateral primary osteoarthritis, left hip: Secondary | ICD-10-CM | POA: Diagnosis not present

## 2023-02-19 LAB — CBC
HCT: 33.9 % — ABNORMAL LOW (ref 39.0–52.0)
Hemoglobin: 11.4 g/dL — ABNORMAL LOW (ref 13.0–17.0)
MCH: 31.2 pg (ref 26.0–34.0)
MCHC: 33.6 g/dL (ref 30.0–36.0)
MCV: 92.9 fL (ref 80.0–100.0)
Platelets: 193 10*3/uL (ref 150–400)
RBC: 3.65 MIL/uL — ABNORMAL LOW (ref 4.22–5.81)
RDW: 13.2 % (ref 11.5–15.5)
WBC: 11.9 10*3/uL — ABNORMAL HIGH (ref 4.0–10.5)
nRBC: 0 % (ref 0.0–0.2)

## 2023-02-19 LAB — BASIC METABOLIC PANEL
Anion gap: 9 (ref 5–15)
BUN: 22 mg/dL (ref 8–23)
CO2: 23 mmol/L (ref 22–32)
Calcium: 8.6 mg/dL — ABNORMAL LOW (ref 8.9–10.3)
Chloride: 101 mmol/L (ref 98–111)
Creatinine, Ser: 0.83 mg/dL (ref 0.61–1.24)
GFR, Estimated: >60 mL/min (ref >60)
Glucose, Bld: 131 mg/dL — ABNORMAL HIGH (ref 70–99)
Potassium: 4.6 mmol/L (ref 3.5–5.1)
Sodium: 133 mmol/L — ABNORMAL LOW (ref 135–145)

## 2023-02-19 MED ORDER — TRAMADOL HCL 50 MG PO TABS: 50.0000 mg | ORAL_TABLET | Freq: Four times a day (QID) | ORAL | 0 refills | Status: DC | PRN

## 2023-02-19 MED ORDER — CELECOXIB 200 MG PO CAPS: 200.0000 mg | ORAL_CAPSULE | Freq: Two times a day (BID) | ORAL | 0 refills | Status: DC

## 2023-02-19 MED ORDER — ACETAMINOPHEN 500 MG PO TABS: 1000.0000 mg | ORAL_TABLET | Freq: Four times a day (QID) | ORAL | 0 refills | Status: AC | PRN

## 2023-02-19 MED ORDER — ONDANSETRON HCL 4 MG PO TABS: 4.0000 mg | ORAL_TABLET | Freq: Four times a day (QID) | ORAL | 0 refills | Status: DC | PRN

## 2023-02-19 MED ORDER — ENOXAPARIN SODIUM 40 MG/0.4ML IJ SOSY: 40.0000 mg | PREFILLED_SYRINGE | INTRAMUSCULAR | 0 refills | Status: DC

## 2023-02-19 NOTE — Evaluation (Signed)
Physical Therapy Evaluation Patient Details Name: Curtis Carroll MRN: [id number] DOB: Feb 09, 1946 Today's Date: 02/19/2023  History of Present Illness  77 y/o male s/p L THA on 02/18/23. PMH: hx of polio with R LE weakness, HTN  Clinical Impression  Patient s/p above surgery. PTA, patient lives with wife who can assist at discharge. Patient was utilizing cane prior to surgery since initial injury. Patient functioning at modI level with RW for all mobility this date. Able to negotiate 4 stairs with B handrails and step to pattern. Inquiring about when to transition to cane but educated patient to utilize RW for first week as patient has not experienced pain since surgery due to anesthesia, patient verbalized understanding. Patient will benefit from skilled PT services during acute stay to address listed deficits. Patient will benefit from ongoing therapy at discharge to maximize functional independence and safety.          Equipment Recommendations None recommended by PT  Recommendations for Other Services       Functional Status Assessment Patient has had a recent decline in their functional status and demonstrates the ability to make significant improvements in function in a reasonable and predictable amount of time.     Precautions / Restrictions Precautions Precautions: Fall Precaution Comments: no hip precautions Restrictions Weight Bearing Restrictions: Yes LLE Weight Bearing: Weight bearing as tolerated      Mobility  Bed Mobility Overal bed mobility: Modified Independent                  Transfers Overall transfer level: Modified independent Equipment used: Rolling Jheremy Boger (2 wheels)                    Ambulation/Gait Ambulation/Gait assistance: Modified independent (Device/Increase time) Gait Distance (Feet): 600 Feet Assistive device: Rolling Lauri Purdum (2 wheels) Gait Pattern/deviations: Step-through pattern, Decreased stride length, Decreased stance  time - right Gait velocity: decreased        Stairs Stairs: Yes Stairs assistance: Modified independent (Device/Increase time) Stair Management: Two rails, Step to pattern, Forwards Number of Stairs: 4 General stair comments: \  Wheelchair Mobility     Tilt Bed    Modified Rankin (Stroke Patients Only)       Balance Overall balance assessment: Mild deficits observed, not formally tested                                           Pertinent Vitals/Pain Pain Assessment Pain Assessment: No/denies pain    Home Living Family/patient expects to be discharged to:: Private residence Living Arrangements: Spouse/significant other Available Help at Discharge: Family;Available 24 hours/day Type of Home: House Home Access: Stairs to enter   [employer] of Steps: 1   Home Layout: One level Home Equipment: Agricultural consultant (2 wheels);Cane - single point;Shower seat;Tub bench      Prior Function Prior Level of Function : Independent/Modified Independent             Mobility Comments: utilizing cane in the past 8 weeks after injury       Extremity/Trunk Assessment   Upper Extremity Assessment Upper Extremity Assessment: Overall WFL for tasks assessed    Lower Extremity Assessment Lower Extremity Assessment: RLE deficits/detail;LLE deficits/detail RLE Deficits / Details: hx of polio with R LE weakness LLE Deficits / Details: grossly 3+/5       Communication   Communication Communication: No  apparent difficulties  Cognition Arousal: Alert Behavior During Therapy: WFL for tasks assessed/performed Overall Cognitive Status: Within Functional Limits for tasks assessed                                          General Comments      Exercises     Assessment/Plan    PT Assessment Patient needs continued PT services  PT Problem List Decreased strength;Decreased activity tolerance;Decreased balance;Decreased  mobility;Decreased range of motion;Decreased knowledge of use of DME       PT Treatment Interventions DME instruction;Gait training;Stair training;Functional mobility training;Therapeutic activities;Therapeutic exercise;Balance training;Patient/family education    PT Goals (Current goals can be found in the Care Plan section)  Acute Rehab PT Goals Patient Stated Goal: to go home PT Goal Formulation: With patient Time For Goal Achievement: 03/05/23 Potential to Achieve Goals: Good    Frequency BID     Co-evaluation               AM-PAC PT "6 Clicks" Mobility  Outcome Measure Help needed turning from your back to your side while in a flat bed without using bedrails?: None Help needed moving from lying on your back to sitting on the side of a flat bed without using bedrails?: None Help needed moving to and from a bed to a chair (including a wheelchair)?: None Help needed standing up from a chair using your arms (e.g., wheelchair or bedside chair)?: None Help needed to walk in hospital room?: None Help needed climbing 3-5 steps with a railing? : None 6 Click Score: 24    End of Session   Activity Tolerance: Patient tolerated treatment well Patient left: in chair;with call bell/phone within reach Nurse Communication: Mobility status PT Visit Diagnosis: Unsteadiness on feet (R26.81);Muscle weakness (generalized) (M62.81)    Time: 7829-5621 PT Time Calculation (min) (ACUTE ONLY): 25 min   Charges:   PT Evaluation $PT Eval Low Complexity: 1 Low PT Treatments $Therapeutic Activity: 8-22 mins PT General Charges $$ ACUTE PT VISIT: 1 Visit         Maylon Peppers, PT, DPT Physical Therapist - [facility]  [facility]   Berel Najjar A Lanitra Battaglini 02/19/2023, 9:16 AM

## 2023-02-19 NOTE — Plan of Care (Signed)
Patient discharged per MD orders at this time.All dc instructions,education and medications reviewed with the patient.Pt expressed understanding and will comply with dc instructions.follow up appointments was also communicated to the Pt. no verbal c/o or any ssx of distress.Pt was discharged home with HH/PT/OT services per order.Pt was transported home by wife in a privately owned vehicle.
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
## 2023-02-19 NOTE — Anesthesia Postprocedure Evaluation (Signed)
Anesthesia Post Note  Patient: Curtis Carroll  Procedure(s) Performed: TOTAL HIP ARTHROPLASTY ANTERIOR APPROACH (Left: Hip)  Patient location during evaluation: Nursing Unit Anesthesia Type: Spinal Level of consciousness: oriented and awake and alert Pain management: pain level controlled Vital Signs Assessment: post-procedure vital signs reviewed and stable Respiratory status: spontaneous breathing and respiratory function stable Cardiovascular status: blood pressure returned to baseline and stable Postop Assessment: no headache, no backache, no apparent nausea or vomiting and patient able to bend at knees Anesthetic complications: no   No notable events documented.   Last Vitals:  Vitals:   02/19/23 0346 02/19/23 0834  BP: 102/62 98/60  Pulse: (!) 58 62  Resp: 16 18  Temp: 36.4 C 36.4 C  SpO2: 93% 91%    Last Pain:  Vitals:   02/19/23 0405  TempSrc:   PainSc: 2                  Jules Schick

## 2023-02-19 NOTE — TOC Progression Note (Signed)
Transition of Care [facility]) - Progression Note    Patient Details  Name: Curtis Carroll MRN: [id number] Date of Birth: [birth year]  [facility] CM/SW Contact  Marlowe Sax, RN Phone Number: 02/19/2023, 9:58 AM  Clinical Narrative:     The patient lives at home with his spouse, he has Agricultural consultant (2 wheels);Cane - single point;Shower seat;Tub bench  at home, he is set up with [facility] for home health services prior to [place] by surgeons office  Expected Discharge Plan: Home w Home Health Services Barriers to Discharge: No Barriers Identified  Expected Discharge Plan and Services   Discharge Planning Services: CM Consult Post Acute Care Choice: Home Health Living arrangements for the past 2 months: Single Family Home Expected Discharge Date: 02/19/23               DME Arranged: N/A DME Agency: NA       HH Arranged: PT, OT HH Agency: [facility] Date HH Agency Contacted: 02/19/23 Time HH Agency Contacted: [phone] Representative spoke with at [facility] Agency: Barbara Cower   Social Determinants of Health (SDOH) Interventions SDOH Screenings   Food Insecurity: No Food Insecurity (02/18/2023)  Housing: Low Risk  (02/18/2023)  Transportation Needs: No Transportation Needs (02/18/2023)  Utilities: Not At Risk (02/18/2023)  Financial Resource Strain: Low Risk  (12/30/2022)   Received from [facility]  Tobacco Use: Medium Risk (02/18/2023)    Readmission Risk Interventions     No data to display

## 2023-02-19 NOTE — Care Management Obs Status (Signed)
MEDICARE OBSERVATION STATUS NOTIFICATION   Patient Details  Name: Curtis Carroll MRN: [id number] Date of Birth: 19-Feb-1946   Medicare Observation Status Notification Given:  Yes    Marlowe Sax, RN 02/19/2023, 10:00 AM

## 2023-02-19 NOTE — Plan of Care (Signed)
Problem: Education: Goal: Knowledge of the prescribed therapeutic regimen will improve Outcome: Progressing Goal: Understanding of discharge needs will improve Outcome: Progressing   Problem: Activity: Goal: Ability to avoid complications of mobility impairment will improve Outcome: Progressing Goal: Ability to tolerate increased activity will improve Outcome: Progressing   Problem: Clinical Measurements: Goal: Postoperative complications will be avoided or minimized Outcome: Progressing   Problem: Pain Management: Goal: Pain level will decrease with appropriate interventions Outcome: Progressing   Problem: Education: Goal: Knowledge of General Education information will improve Description: Including pain rating scale, medication(s)/side effects and non-pharmacologic comfort measures Outcome: Progressing   Problem: Health Behavior/Discharge Planning: Goal: Ability to manage health-related needs will improve Outcome: Progressing   Problem: Clinical Measurements: Goal: Ability to maintain clinical measurements within normal limits will improve Outcome: Progressing Goal: Will remain free from infection Outcome: Progressing Goal: Diagnostic test results will improve Outcome: Progressing Goal: Respiratory complications will improve Outcome: Progressing Goal: Cardiovascular complication will be avoided Outcome: Progressing   Problem: Activity: Goal: Risk for activity intolerance will decrease Outcome: Progressing   Problem: Nutrition: Goal: Adequate nutrition will be maintained Outcome: Progressing   Problem: Coping: Goal: Level of anxiety will decrease Outcome: Progressing   Problem: Elimination: Goal: Will not experience complications related to bowel motility Outcome: Progressing Goal: Will not experience complications related to urinary retention Outcome: Progressing   Problem: Pain Managment: Goal: General experience of comfort will improve Outcome:  Progressing   Problem: Safety: Goal: Ability to remain free from injury will improve Outcome: Progressing   Problem: Skin Integrity: Goal: Risk for impaired skin integrity will decrease Outcome: Progressing

## 2023-02-22 LAB — SURGICAL PATHOLOGY

## 2023-03-07 ENCOUNTER — Ambulatory Visit: Payer: Medicare Other | Attending: Orthopedic Surgery

## 2023-03-07 DIAGNOSIS — M25552 Pain in left hip: Secondary | ICD-10-CM | POA: Insufficient documentation

## 2023-03-07 DIAGNOSIS — M6281 Muscle weakness (generalized): Secondary | ICD-10-CM | POA: Insufficient documentation

## 2023-03-07 DIAGNOSIS — R2681 Unsteadiness on feet: Secondary | ICD-10-CM | POA: Diagnosis present
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
## 2023-03-12 ENCOUNTER — Ambulatory Visit: Payer: Medicare Other

## 2023-03-12 DIAGNOSIS — R2681 Unsteadiness on feet: Secondary | ICD-10-CM

## 2023-03-12 DIAGNOSIS — M6281 Muscle weakness (generalized): Secondary | ICD-10-CM | POA: Diagnosis not present

## 2023-03-12 DIAGNOSIS — M25552 Pain in left hip: Secondary | ICD-10-CM
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
## 2023-03-14 ENCOUNTER — Ambulatory Visit: Payer: Medicare Other

## 2023-03-14 DIAGNOSIS — M6281 Muscle weakness (generalized): Secondary | ICD-10-CM

## 2023-03-14 DIAGNOSIS — R2681 Unsteadiness on feet: Secondary | ICD-10-CM
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
## 2023-03-18 ENCOUNTER — Ambulatory Visit: Admit: 2023-03-18 | Payer: Medicare Other

## 2023-03-18 SURGERY — COLONOSCOPY WITH PROPOFOL
Anesthesia: General

## 2023-03-20 ENCOUNTER — Ambulatory Visit: Payer: Medicare Other

## 2023-03-20 DIAGNOSIS — M6281 Muscle weakness (generalized): Secondary | ICD-10-CM

## 2023-03-20 DIAGNOSIS — R2681 Unsteadiness on feet: Secondary | ICD-10-CM
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
## 2023-03-22 ENCOUNTER — Ambulatory Visit: Payer: Medicare Other

## 2023-03-22 DIAGNOSIS — R2681 Unsteadiness on feet: Secondary | ICD-10-CM

## 2023-03-22 DIAGNOSIS — M6281 Muscle weakness (generalized): Secondary | ICD-10-CM | POA: Diagnosis not present
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
## 2023-03-26 ENCOUNTER — Ambulatory Visit: Payer: Medicare Other

## 2023-03-26 DIAGNOSIS — M6281 Muscle weakness (generalized): Secondary | ICD-10-CM

## 2023-03-26 DIAGNOSIS — R2681 Unsteadiness on feet: Secondary | ICD-10-CM
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
## 2023-03-28 ENCOUNTER — Ambulatory Visit: Payer: Medicare Other

## 2023-03-28 DIAGNOSIS — M6281 Muscle weakness (generalized): Secondary | ICD-10-CM | POA: Diagnosis not present

## 2023-03-28 DIAGNOSIS — R2681 Unsteadiness on feet: Secondary | ICD-10-CM
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
## 2023-04-02 ENCOUNTER — Ambulatory Visit: Payer: Medicare Other | Attending: Orthopedic Surgery

## 2023-04-02 DIAGNOSIS — R2681 Unsteadiness on feet: Secondary | ICD-10-CM | POA: Diagnosis present

## 2023-04-02 DIAGNOSIS — M25552 Pain in left hip: Secondary | ICD-10-CM | POA: Diagnosis present

## 2023-04-02 DIAGNOSIS — M6281 Muscle weakness (generalized): Secondary | ICD-10-CM | POA: Insufficient documentation
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
## 2023-04-04 ENCOUNTER — Ambulatory Visit: Payer: Medicare Other

## 2023-04-04 DIAGNOSIS — M6281 Muscle weakness (generalized): Secondary | ICD-10-CM

## 2023-04-04 DIAGNOSIS — R2681 Unsteadiness on feet: Secondary | ICD-10-CM

## 2023-04-04 DIAGNOSIS — M25552 Pain in left hip: Secondary | ICD-10-CM
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
## 2023-04-09 ENCOUNTER — Ambulatory Visit: Payer: Medicare Other

## 2023-04-09 DIAGNOSIS — M6281 Muscle weakness (generalized): Secondary | ICD-10-CM | POA: Diagnosis not present

## 2023-04-09 DIAGNOSIS — R2681 Unsteadiness on feet: Secondary | ICD-10-CM
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
## 2023-04-11 ENCOUNTER — Ambulatory Visit: Payer: Medicare Other

## 2023-04-11 DIAGNOSIS — M6281 Muscle weakness (generalized): Secondary | ICD-10-CM | POA: Diagnosis not present

## 2023-04-11 DIAGNOSIS — R2681 Unsteadiness on feet: Secondary | ICD-10-CM

## 2023-04-11 DIAGNOSIS — M25552 Pain in left hip: Secondary | ICD-10-CM
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
## 2023-04-16 ENCOUNTER — Ambulatory Visit: Payer: Medicare Other

## 2023-04-16 DIAGNOSIS — M6281 Muscle weakness (generalized): Secondary | ICD-10-CM | POA: Diagnosis not present

## 2023-04-16 DIAGNOSIS — M25552 Pain in left hip: Secondary | ICD-10-CM

## 2023-04-16 DIAGNOSIS — R2681 Unsteadiness on feet: Secondary | ICD-10-CM
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
## 2023-04-18 ENCOUNTER — Ambulatory Visit: Payer: Medicare Other

## 2023-04-18 DIAGNOSIS — R2681 Unsteadiness on feet: Secondary | ICD-10-CM

## 2023-04-18 DIAGNOSIS — M6281 Muscle weakness (generalized): Secondary | ICD-10-CM | POA: Diagnosis not present

## 2023-04-18 DIAGNOSIS — M25552 Pain in left hip: Secondary | ICD-10-CM

## 2023-08-09 ENCOUNTER — Encounter: Payer: Self-pay | Admitting: *Deleted

## 2023-08-12 ENCOUNTER — Inpatient Hospital Stay (HOSPITAL_COMMUNITY)
Admission: AD | Admit: 2023-08-12 | Discharge: 2023-08-12 | Disposition: A | Discharge status: Home / Self Care | Source: Home / Self Care | Admitting: Family Medicine

## 2023-08-12 ENCOUNTER — Encounter: Payer: Self-pay | Admitting: *Deleted

## 2023-08-12 ENCOUNTER — Ambulatory Visit: Admitting: Anesthesiology

## 2023-08-12 ENCOUNTER — Inpatient Hospital Stay

## 2023-08-12 ENCOUNTER — Inpatient Hospital Stay
Admission: AD | Admit: 2023-08-12 | Discharge: 2023-08-15 | DRG: 309 | Disposition: A | Discharge status: Home / Self Care | Payer: Medicare Other | Attending: Internal Medicine | Admitting: Internal Medicine

## 2023-08-12 ENCOUNTER — Encounter
Admission: AD | Disposition: A | Discharge status: Home / Self Care | Payer: Self-pay | Source: Home / Self Care | Attending: Internal Medicine

## 2023-08-12 ENCOUNTER — Other Ambulatory Visit: Payer: Self-pay

## 2023-08-12 DIAGNOSIS — M21969 Unspecified acquired deformity of unspecified lower leg: Secondary | ICD-10-CM | POA: Diagnosis present

## 2023-08-12 DIAGNOSIS — E871 Hypo-osmolality and hyponatremia: Secondary | ICD-10-CM | POA: Diagnosis present

## 2023-08-12 DIAGNOSIS — K219 Gastro-esophageal reflux disease without esophagitis: Secondary | ICD-10-CM | POA: Diagnosis present

## 2023-08-12 DIAGNOSIS — E039 Hypothyroidism, unspecified: Secondary | ICD-10-CM | POA: Diagnosis present

## 2023-08-12 DIAGNOSIS — I483 Typical atrial flutter: Secondary | ICD-10-CM

## 2023-08-12 DIAGNOSIS — Z8719 Personal history of other diseases of the digestive system: Secondary | ICD-10-CM

## 2023-08-12 DIAGNOSIS — I4892 Unspecified atrial flutter: Secondary | ICD-10-CM | POA: Diagnosis present

## 2023-08-12 DIAGNOSIS — G8929 Other chronic pain: Secondary | ICD-10-CM | POA: Diagnosis present

## 2023-08-12 DIAGNOSIS — Z880 Allergy status to penicillin: Secondary | ICD-10-CM

## 2023-08-12 DIAGNOSIS — K900 Celiac disease: Secondary | ICD-10-CM | POA: Diagnosis present

## 2023-08-12 DIAGNOSIS — Z79890 Hormone replacement therapy (postmenopausal): Secondary | ICD-10-CM | POA: Diagnosis not present

## 2023-08-12 DIAGNOSIS — Z79899 Other long term (current) drug therapy: Secondary | ICD-10-CM | POA: Diagnosis not present

## 2023-08-12 DIAGNOSIS — Z538 Procedure and treatment not carried out for other reasons: Secondary | ICD-10-CM | POA: Diagnosis not present

## 2023-08-12 DIAGNOSIS — Z8249 Family history of ischemic heart disease and other diseases of the circulatory system: Secondary | ICD-10-CM

## 2023-08-12 DIAGNOSIS — Z860100 Personal history of colon polyps, unspecified: Secondary | ICD-10-CM | POA: Diagnosis not present

## 2023-08-12 DIAGNOSIS — I4891 Unspecified atrial fibrillation: Secondary | ICD-10-CM

## 2023-08-12 DIAGNOSIS — Z96642 Presence of left artificial hip joint: Secondary | ICD-10-CM | POA: Diagnosis present

## 2023-08-12 DIAGNOSIS — R000 Tachycardia, unspecified: Secondary | ICD-10-CM | POA: Diagnosis present

## 2023-08-12 DIAGNOSIS — I10 Essential (primary) hypertension: Secondary | ICD-10-CM | POA: Diagnosis present

## 2023-08-12 DIAGNOSIS — Z96652 Presence of left artificial knee joint: Secondary | ICD-10-CM | POA: Diagnosis present

## 2023-08-12 DIAGNOSIS — E785 Hyperlipidemia, unspecified: Secondary | ICD-10-CM | POA: Diagnosis present

## 2023-08-12 DIAGNOSIS — I4430 Unspecified atrioventricular block: Secondary | ICD-10-CM | POA: Diagnosis present

## 2023-08-12 DIAGNOSIS — Z23 Encounter for immunization: Secondary | ICD-10-CM

## 2023-08-12 DIAGNOSIS — Z6830 Body mass index (BMI) 30.0-30.9, adult: Secondary | ICD-10-CM | POA: Diagnosis not present

## 2023-08-12 DIAGNOSIS — E66811 Obesity, class 1: Secondary | ICD-10-CM | POA: Diagnosis present

## 2023-08-12 DIAGNOSIS — Z791 Long term (current) use of non-steroidal anti-inflammatories (NSAID): Secondary | ICD-10-CM

## 2023-08-12 DIAGNOSIS — Z7901 Long term (current) use of anticoagulants: Secondary | ICD-10-CM | POA: Diagnosis not present

## 2023-08-12 DIAGNOSIS — E739 Lactose intolerance, unspecified: Secondary | ICD-10-CM | POA: Diagnosis present

## 2023-08-12 DIAGNOSIS — Z885 Allergy status to narcotic agent status: Secondary | ICD-10-CM

## 2023-08-12 DIAGNOSIS — Z87891 Personal history of nicotine dependence: Secondary | ICD-10-CM | POA: Diagnosis not present

## 2023-08-12 DIAGNOSIS — R7303 Prediabetes: Secondary | ICD-10-CM | POA: Diagnosis present

## 2023-08-12 DIAGNOSIS — E860 Dehydration: Secondary | ICD-10-CM | POA: Diagnosis present

## 2023-08-12 DIAGNOSIS — Z91018 Allergy to other foods: Secondary | ICD-10-CM

## 2023-08-12 HISTORY — PX: COLONOSCOPY WITH PROPOFOL: SHX5780

## 2023-08-12 LAB — COMPREHENSIVE METABOLIC PANEL
ALT: 21 U/L (ref 0–44)
AST: 25 U/L (ref 15–41)
Albumin: 3.6 g/dL (ref 3.5–5.0)
Alkaline Phosphatase: 65 U/L (ref 38–126)
Anion gap: 11 (ref 5–15)
BUN: 16 mg/dL (ref 8–23)
CO2: 16 mmol/L — ABNORMAL LOW (ref 22–32)
Calcium: 7.9 mg/dL — ABNORMAL LOW (ref 8.9–10.3)
Chloride: 101 mmol/L (ref 98–111)
Creatinine, Ser: 0.66 mg/dL (ref 0.61–1.24)
GFR, Estimated: >60 mL/min (ref >60)
Glucose, Bld: 80 mg/dL (ref 70–99)
Potassium: 4.1 mmol/L (ref 3.5–5.1)
Sodium: 128 mmol/L — ABNORMAL LOW (ref 135–145)
Total Bilirubin: 1.2 mg/dL (ref 0.0–1.2)
Total Protein: 6 g/dL — ABNORMAL LOW (ref 6.5–8.1)

## 2023-08-12 LAB — CBC
HCT: 43.9 % (ref 39.0–52.0)
Hemoglobin: 14.7 g/dL (ref 13.0–17.0)
MCH: 30.7 pg (ref 26.0–34.0)
MCHC: 33.5 g/dL (ref 30.0–36.0)
MCV: 91.6 fL (ref 80.0–100.0)
Platelets: 170 10*3/uL (ref 150–400)
RBC: 4.79 MIL/uL (ref 4.22–5.81)
RDW: 14.3 % (ref 11.5–15.5)
WBC: 7 10*3/uL (ref 4.0–10.5)
nRBC: 0 % (ref 0.0–0.2)

## 2023-08-12 LAB — HEPARIN LEVEL (UNFRACTIONATED): Heparin Unfractionated: 0.72 [IU]/mL — ABNORMAL HIGH (ref 0.30–0.70)

## 2023-08-12 LAB — ECHOCARDIOGRAM COMPLETE
AR max vel: 2.46 cm2
AV Area VTI: 2.03 cm2
AV Area mean vel: 2.35 cm2
AV Mean grad: 2 mmHg
AV Peak grad: 4.3 mmHg
Ao pk vel: 1.04 m/s
Area-P 1/2: 5.23 cm2
Height: 70 in
MV VTI: 3.27 cm2
S' Lateral: 2.9 cm
Weight: 3347.46 [oz_av]

## 2023-08-12 LAB — PROTIME-INR
INR: 1.2 (ref 0.8–1.2)
Prothrombin Time: 15.9 s — ABNORMAL HIGH (ref 11.4–15.2)

## 2023-08-12 LAB — SODIUM
Sodium: 136 mmol/L (ref 135–145)
Sodium: 134 mmol/L — ABNORMAL LOW (ref 135–145)
Sodium: 138 mmol/L (ref 135–145)

## 2023-08-12 LAB — CREATININE, URINE, RANDOM: Creatinine, Urine: 53 mg/dL

## 2023-08-12 LAB — OSMOLALITY, URINE: Osmolality, Ur: 371 mosm/kg (ref 300–900)

## 2023-08-12 LAB — APTT
aPTT: >200 s (ref 24–36)
aPTT: 175 s (ref 24–36)

## 2023-08-12 LAB — GLUCOSE, CAPILLARY: Glucose-Capillary: 82 mg/dL (ref 70–99)

## 2023-08-12 LAB — OSMOLALITY: Osmolality: 282 mosm/kg (ref 275–295)

## 2023-08-12 LAB — TROPONIN I (HIGH SENSITIVITY)
Troponin I (High Sensitivity): 8 ng/L (ref <18)
Troponin I (High Sensitivity): 10 ng/L (ref <18)

## 2023-08-12 LAB — SODIUM, URINE, RANDOM: Sodium, Ur: 94 mmol/L

## 2023-08-12 LAB — MRSA NEXT GEN BY PCR, NASAL: MRSA by PCR Next Gen: NOT DETECTED

## 2023-08-12 SURGERY — COLONOSCOPY WITH PROPOFOL
Anesthesia: General

## 2023-08-12 MED ORDER — HEPARIN (PORCINE) 25000 UT/250ML-% IV SOLN
1000.0000 [IU]/h | INTRAVENOUS | Status: DC
  Administered 2023-08-12 – 2023-08-13 (×2): 1300 [IU]/h via INTRAVENOUS
  Administered 2023-08-14: 1000 [IU]/h via INTRAVENOUS
  Filled 2023-08-12 (×3): qty 250

## 2023-08-12 MED ORDER — MELATONIN 5 MG PO TABS
5.0000 mg | ORAL_TABLET | Freq: Every evening | ORAL | Status: DC | PRN
  Administered 2023-08-12 – 2023-08-14 (×3): 5 mg via ORAL
  Filled 2023-08-12 (×3): qty 1

## 2023-08-12 MED ORDER — ONDANSETRON HCL 4 MG PO TABS: 4.0000 mg | ORAL_TABLET | Freq: Four times a day (QID) | ORAL | Status: DC | PRN

## 2023-08-12 MED ORDER — ONDANSETRON HCL 4 MG/2ML IJ SOLN: 4.0000 mg | Freq: Four times a day (QID) | INTRAMUSCULAR | Status: DC | PRN

## 2023-08-12 MED ORDER — PERFLUTREN LIPID MICROSPHERE
1.0000 mL | INTRAVENOUS | Status: DC | PRN
  Administered 2023-08-12: 6 mL via INTRAVENOUS

## 2023-08-12 MED ORDER — PROPOFOL 1000 MG/100ML IV EMUL
INTRAVENOUS | Status: AC
  Filled 2023-08-12: qty 100

## 2023-08-12 MED ORDER — METOPROLOL TARTRATE 25 MG PO TABS
25.0000 mg | ORAL_TABLET | Freq: Two times a day (BID) | ORAL | Status: DC
  Administered 2023-08-12: 25 mg via ORAL
  Filled 2023-08-12: qty 1

## 2023-08-12 MED ORDER — ENOXAPARIN SODIUM 40 MG/0.4ML IJ SOSY: 40.0000 mg | PREFILLED_SYRINGE | INTRAMUSCULAR | Status: DC

## 2023-08-12 MED ORDER — PROPOFOL 10 MG/ML IV BOLUS
INTRAVENOUS | Status: DC | PRN
  Administered 2023-08-12: 75 mg via INTRAVENOUS

## 2023-08-12 MED ORDER — SODIUM CHLORIDE 0.9 % IV SOLN: INTRAVENOUS | Status: DC

## 2023-08-12 MED ORDER — METOPROLOL TARTRATE 5 MG/5ML IV SOLN
5.0000 mg | Freq: Once | INTRAVENOUS | Status: AC
  Administered 2023-08-12: 5 mg via INTRAVENOUS

## 2023-08-12 MED ORDER — HEPARIN BOLUS VIA INFUSION
5000.0000 [IU] | Freq: Once | INTRAVENOUS | Status: AC
  Administered 2023-08-12: 5000 [IU] via INTRAVENOUS
  Filled 2023-08-12: qty 5000

## 2023-08-12 MED ORDER — SODIUM CHLORIDE 0.9 % IV SOLN: INTRAVENOUS | Status: AC

## 2023-08-12 MED ORDER — METOPROLOL TARTRATE 5 MG/5ML IV SOLN
INTRAVENOUS | Status: AC
  Filled 2023-08-12: qty 5

## 2023-08-12 MED ORDER — CHLORHEXIDINE GLUCONATE CLOTH 2 % EX PADS
6.0000 | MEDICATED_PAD | Freq: Every day | CUTANEOUS | Status: DC
  Administered 2023-08-12 – 2023-08-15 (×3): 6 via TOPICAL

## 2023-08-12 MED ORDER — PNEUMOCOCCAL 20-VAL CONJ VACC 0.5 ML IM SUSY
0.5000 mL | PREFILLED_SYRINGE | INTRAMUSCULAR | Status: AC
  Administered 2023-08-14: 0.5 mL via INTRAMUSCULAR
  Filled 2023-08-12: qty 0.5

## 2023-08-12 MED ORDER — ACETAMINOPHEN 325 MG PO TABS: 650.0000 mg | ORAL_TABLET | Freq: Four times a day (QID) | ORAL | Status: DC | PRN

## 2023-08-12 MED ORDER — DILTIAZEM HCL-DEXTROSE 125-5 MG/125ML-% IV SOLN (PREMIX)
5.0000 mg/h | INTRAVENOUS | Status: DC
Start: 2023-08-12 — End: 2023-08-13
  Administered 2023-08-12 (×2): 5 mg/h via INTRAVENOUS
  Filled 2023-08-12: qty 125

## 2023-08-12 NOTE — Plan of Care (Signed)
  Problem: Education: Goal: Knowledge of General Education information will improve Description Including pain rating scale, medication(s)/side effects and non-pharmacologic comfort measures Outcome: Progressing   Problem: Clinical Measurements: Goal: Ability to maintain clinical measurements within normal limits will improve Outcome: Progressing Goal: Will remain free from infection Outcome: Progressing Goal: Respiratory complications will improve Outcome: Progressing Goal: Cardiovascular complication will be avoided Outcome: Progressing   Problem: Safety: Goal: Ability to remain free from injury will improve Outcome: Progressing   Problem: Skin Integrity: Goal: Risk for impaired skin integrity will decrease Outcome: Progressing
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
## 2023-08-12 NOTE — OR Nursing (Addendum)
 0849 PT HEART RATE RANGING 234 TO LOW 120S. ANESTHESIA MD IN ROOM. RAPID RESPONSE CALLED. DR ARIDA PAGED FOR CARDIOLOGY.

## 2023-08-12 NOTE — Assessment & Plan Note (Addendum)
 Patient with development of new onset atrial fibrillation with RVR with heart rate into the 130s prior to colonoscopy today CHADSVASc score around 3- defer [facility] pending cardiology evaluation  Start dilt gtt 2D ECHO  Follow up cardiology recommendations

## 2023-08-12 NOTE — Assessment & Plan Note (Addendum)
 Cont synthroid  Check thyroid panel in setting of new onset atrial fibrillation

## 2023-08-12 NOTE — Transfer of Care (Signed)
 Immediate Anesthesia Transfer of Care Note  Patient: KALUM MINNER  Procedure(s) Performed: COLONOSCOPY WITH PROPOFOL  Patient Location: PACU  Anesthesia Type:General  Level of Consciousness: awake, alert , and oriented  Airway & Oxygen Therapy: Patient Spontanous Breathing and Patient connected to face mask oxygen  Post-op Assessment: Report given to RN and Post -op Vital signs reviewed and stable  Post vital signs: Reviewed and stable  Last Vitals:  Vitals Value Taken Time  BP    Temp 36.4 C 08/12/23 0859  Pulse 82 08/12/23 0903  Resp 14 08/12/23 0903  SpO2 96 % 08/12/23 0903  Vitals shown include unfiled device data.  Last Pain:  Vitals:   08/12/23 0859  TempSrc: Temporal  PainSc:          Complications:  Encounter Notable Events  Notable Event Outcome Phase Comment  Dysrhythmia  Intraprocedure SVT VS  flutter  Heart rate in 230's

## 2023-08-12 NOTE — Assessment & Plan Note (Signed)
 Was due for colonoscopy today  Hold in setting of active atrial fibrillaton
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
## 2023-08-12 NOTE — Anesthesia Postprocedure Evaluation (Signed)
 Anesthesia Post Note  Patient: Curtis Carroll  Procedure(s) Performed: COLONOSCOPY WITH PROPOFOL  Patient location during evaluation: Endoscopy Anesthesia Type: General Level of consciousness: awake and alert Pain management: pain level controlled Vital Signs Assessment: post-procedure vital signs reviewed and stable Respiratory status: spontaneous breathing, nonlabored ventilation, respiratory function stable and patient connected to nasal cannula oxygen Cardiovascular status: blood pressure returned to baseline Postop Assessment: no apparent nausea or vomiting Anesthetic complications: yes Comments: Patient went into aflutter after induction, 12L EKG obtained, labs sent, 5mg  metoprolol given, cardiology consult    Encounter Notable Events  Notable Event Outcome Phase Comment  Dysrhythmia  Intraprocedure SVT VS  flutter  Heart rate in 230's     Last Vitals:  Vitals:   08/12/23 0914 08/12/23 0930  BP: 120/89 (!) 142/98  Pulse:  (!) 120  Resp:  (!) 21  Temp:    SpO2:  97%    Last Pain:  Vitals:   08/12/23 0914  TempSrc:   PainSc: 0-No pain                 Louie Boston

## 2023-08-12 NOTE — Progress Notes (Signed)
 Aborted case per anesthesia heart rate in the 220s. Stat EKG ordered and done in the room. Rapid response called. EKG done. Anesthesiologist said the patient was stable so he could be taken to the post op area.

## 2023-08-12 NOTE — Assessment & Plan Note (Signed)
 Sodium 128 Mildly dry would likely contribution of GoLytely bowel prep IV fluid hydration Trend sodium Goal 6-8 mill equivalent change over the next 12 to 24 hours Check urine and serum studies Monitor

## 2023-08-12 NOTE — Progress Notes (Signed)
   08/12/23 0845  Spiritual Encounters  Type of Visit Initial  Care provided to: [facility] partners present during encounter Nurse  Reason for visit Code  OnCall Visit Yes   Chaplain assisted On-Call Chaplain by going to this Rapid Response.  Chaplain checked with Staff regarding and family who may be present and offered [employer] and a compassionate presence to patient's wife who was in the Endo Waiting Room.    Rev. Rana M. Earlene Plater, M.Div. Chaplain Resident [facility]
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
## 2023-08-12 NOTE — OR Nursing (Signed)
 Report given to Joesph July, RN in ICU over the phone and when arriving in ICU for patient handoff in person.

## 2023-08-12 NOTE — OR Nursing (Signed)
 7829 DR NEWTON ON FOR INTERNAL MEDICINE. DR [facility] NOTIFIED HOSPITALIST. PT IN AFLUT[place] PER EKG.. DR NEWTON ORDERS ADMIT TO STEPDOWN . SARAH IN ICU REPORTS BRING PT TO ICU ROOM  6
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
## 2023-08-12 NOTE — Interval H&P Note (Signed)
 History and Physical Interval Note:  08/12/2023 8:33 AM  Curtis Carroll  has presented today for surgery, with the diagnosis of HX OF ADENOMATOUS POLYP OF COLON.  The various methods of treatment have been discussed with the patient and family. After consideration of risks, benefits and other options for treatment, the patient has consented to  Procedure(s): COLONOSCOPY WITH PROPOFOL (N/A) as a surgical intervention.  The patient's history has been reviewed, patient examined, no change in status, stable for surgery.  I have reviewed the patient's chart and labs.  Questions were answered to the patient's satisfaction.     Regis Bill  Ok to proceed with colonoscopy

## 2023-08-12 NOTE — Assessment & Plan Note (Signed)
 BP stable Titrate home regimen
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
## 2023-08-12 NOTE — Progress Notes (Signed)
*  PRELIMINARY RESULTS* Echocardiogram 2D Echocardiogram has been performed.  Carolyne Fiscal 08/12/2023, 2:47 PM

## 2023-08-12 NOTE — Significant Event (Signed)
 Rapid Response Event Note   Reason for Call :  Elevated heart rate  Initial Focused Assessment:  Rapid response arrived in endoscopy bay with patient on stretcher surrounded by endoscopy staff and anesthesia team. Per staff, patient's heart rate got above 200 pre procedure and had been fluctuating. Patient on simple mask for O2 and awake.  Interventions:  Assisted with 12 lead EKG ordered by anesthesia team.   Plan of Care:  No needs from rapid response team at this time. Procedural area reaching out to cardiology and admitting team to admit patient. AC at bedside to facilitate.    Event Summary:   MD Notified: Dr. Joelene Millin and Dr. Mia Creek Call Time: 08:50 Arrival Time: 08:51 End Time: 08:57  Zayaan Kozak, Theresia Lo, RN

## 2023-08-13 ENCOUNTER — Encounter: Payer: Self-pay | Admitting: Gastroenterology

## 2023-08-13 DIAGNOSIS — I4892 Unspecified atrial flutter: Secondary | ICD-10-CM | POA: Diagnosis not present

## 2023-08-13 DIAGNOSIS — I4891 Unspecified atrial fibrillation: Secondary | ICD-10-CM | POA: Diagnosis not present

## 2023-08-13 LAB — CBC
HCT: 40.1 % (ref 39.0–52.0)
Hemoglobin: 13.6 g/dL (ref 13.0–17.0)
MCH: 30.8 pg (ref 26.0–34.0)
MCHC: 33.9 g/dL (ref 30.0–36.0)
MCV: 90.7 fL (ref 80.0–100.0)
Platelets: 162 10*3/uL (ref 150–400)
RBC: 4.42 MIL/uL (ref 4.22–5.81)
RDW: 14.6 % (ref 11.5–15.5)
WBC: 4.8 10*3/uL (ref 4.0–10.5)
nRBC: 0 % (ref 0.0–0.2)

## 2023-08-13 LAB — COMPREHENSIVE METABOLIC PANEL
ALT: 18 U/L (ref 0–44)
AST: 22 U/L (ref 15–41)
Albumin: 3.3 g/dL — ABNORMAL LOW (ref 3.5–5.0)
Alkaline Phosphatase: 59 U/L (ref 38–126)
Anion gap: 6 (ref 5–15)
BUN: 16 mg/dL (ref 8–23)
CO2: 20 mmol/L — ABNORMAL LOW (ref 22–32)
Calcium: 8.6 mg/dL — ABNORMAL LOW (ref 8.9–10.3)
Chloride: 110 mmol/L (ref 98–111)
Creatinine, Ser: 0.66 mg/dL (ref 0.61–1.24)
GFR, Estimated: >60 mL/min (ref >60)
Glucose, Bld: 85 mg/dL (ref 70–99)
Potassium: 4 mmol/L (ref 3.5–5.1)
Sodium: 136 mmol/L (ref 135–145)
Total Bilirubin: 1.1 mg/dL (ref 0.0–1.2)
Total Protein: 5.7 g/dL — ABNORMAL LOW (ref 6.5–8.1)

## 2023-08-13 LAB — THYROID PANEL WITH TSH
Free Thyroxine Index: 3.1 (ref 1.2–4.9)
T3 Uptake Ratio: 31 % (ref 24–39)
T4, Total: 9.9 ug/dL (ref 4.5–12.0)
TSH: 1.37 u[IU]/mL (ref 0.450–4.500)

## 2023-08-13 LAB — HEPARIN LEVEL (UNFRACTIONATED)
Heparin Unfractionated: 0.88 [IU]/mL — ABNORMAL HIGH (ref 0.30–0.70)
Heparin Unfractionated: >1.1 [IU]/mL — ABNORMAL HIGH (ref 0.30–0.70)
Heparin Unfractionated: 0.79 [IU]/mL — ABNORMAL HIGH (ref 0.30–0.70)

## 2023-08-13 LAB — PHOSPHORUS: Phosphorus: 3.2 mg/dL (ref 2.5–4.6)

## 2023-08-13 LAB — MAGNESIUM: Magnesium: 1.9 mg/dL (ref 1.7–2.4)

## 2023-08-13 LAB — SODIUM: Sodium: 136 mmol/L (ref 135–145)

## 2023-08-13 MED ORDER — LEVOTHYROXINE SODIUM 88 MCG PO TABS
88.0000 ug | ORAL_TABLET | Freq: Every day | ORAL | Status: DC
  Administered 2023-08-13 – 2023-08-15 (×3): 88 ug via ORAL
  Filled 2023-08-13 (×3): qty 1

## 2023-08-13 MED ORDER — METOPROLOL TARTRATE 25 MG PO TABS
37.5000 mg | ORAL_TABLET | Freq: Two times a day (BID) | ORAL | Status: DC
  Administered 2023-08-13 – 2023-08-14 (×3): 37.5 mg via ORAL
  Filled 2023-08-13 (×3): qty 2

## 2023-08-13 NOTE — Progress Notes (Addendum)
 Progress Note    Curtis Carroll  VZD:[id number] DOB: 08/20/1945  DOA: 08/12/2023 PCP: Adora Fridge, MD      Brief Narrative:    Medical records reviewed and are as summarized below:  Curtis Carroll is a 78 y.o. male with medical history significant of hypertension, aortic calcifications, hyperlipidemia, hypothyroidism history of colonic polyps, history of GI bleed post colonoscopy, who presented to the hospital for elective colonoscopy on 08/12/2023.  He had colon prep prior to the planned colonoscopy.  At the endoscopy unit, patient was noted to have atrial flutter with rapid ventricular response.  Colonoscopy was aborted and the hospitalist team was consulted to admit the patient for further management.      Assessment/Plan:   Principal Problem:   Atrial fibrillation with RVR (HCC) Active Problems:   Hyponatremia   History of colon polyps   HTN (hypertension)   Hypothyroidism    Body mass index is 30.02 kg/m.  (Class I obesity)    Atrial flutter/atrial fibrillation with RVR: Heart rate has improved.  He is off of IV Cardizem drip.  Continue metoprolol.  He is on IV heparin drip with plan to transition to oral anticoagulant.  Monitor heparin level per protocol.  He is wary about long-term anticoagulation because of history of GI bleeding that usually POST colonoscopy.  However, he said he is willing to take long-term anticoagulants if he has to.  CHA2DS2-VASc is 4. 2D echo showed EF estimated at 50 to 55%, indeterminate LV diastolic parameters. TSH 1.370. Follow-up with cardiologist for further recommendations.   Hypertension: Lisinopril on hold   Hyponatremia: Improved.  This was probably from colon prep.   Colonic polyps with surveillance colonoscopy: Previously planned elective colonoscopy has been aborted for now.  Discussed with Dr. Mia Creek. Diet has been advanced from liquid diet to heart healthy diet.   Comorbidities include  hyperlipidemia, arthritis, hypothyroidism,   Diet Order             Diet clear liquid Room service appropriate? Yes; Fluid consistency: Thin  Diet effective now                            Consultants: Cardiologist  Procedures: None    Medications:    Chlorhexidine Gluconate Cloth  6 each Topical Daily   levothyroxine  88 mcg Oral Q0600   metoprolol tartrate  25 mg Oral BID   pneumococcal 20-valent conjugate vaccine  0.5 mL Intramuscular Tomorrow-1000   Continuous Infusions:  sodium chloride 75 mL/hr at 08/13/23 0701   diltiazem (CARDIZEM) infusion Stopped (08/12/23 1704)   heparin 1,100 Units/hr (08/13/23 0733)     Anti-infectives (From admission, onward)    None              Family Communication/Anticipated D/C date and plan/Code Status   DVT prophylaxis:      Code Status: Full Code  Family Communication: Plan discussed with his wife at the bedside Disposition Plan: Plan to discharge home   Status is: Inpatient Remains inpatient appropriate because: A-fib/a flutter with RVR       Subjective:   Interval events noted.  No chest pain, palpitations or shortness of breath.  He he feels better.  He said he has been on liquid diet for days and wants solid food.  His wife was at the bedside.  Objective:    Vitals:   08/13/23 0400 08/13/23 0500 08/13/23 0600 08/13/23 0700  BP: 101/69 102/67 126/88   Pulse: 95 90 81 69  Resp: 18 19 20  (!) 21  Temp: 98 F (36.7 C)   97.6 F (36.4 C)  TempSrc:      SpO2: 97% 99% 97% 98%  Weight:      Height:       No data found.   Intake/Output Summary (Last 24 hours) at 08/13/2023 0926 Last data filed at 08/13/2023 0701 Gross per 24 hour  Intake 2050.65 ml  Output 1200 ml  Net 850.65 ml   Filed Weights   08/12/23 0746 08/12/23 0930  Weight: 95.3 kg 94.9 kg    Exam:  GEN: NAD SKIN: Warm and dry EYES: No pallor or icterus ENT: MMM CV: Irregular rate and rhythm PULM: CTA B ABD:  soft, ND, NT, +BS CNS: AAO x 3, non focal EXT: No edema or tenderness     Data Reviewed:   I have personally reviewed following labs and imaging studies:  Labs: Labs show the following:   Basic Metabolic Panel: Recent Labs  Lab 08/12/23 0902 08/12/23 1226 08/12/23 1558 08/12/23 2016 08/13/23 0057 08/13/23 0534  NA 128* 136 134* 138 136 136  K 4.1  --   --   --   --  4.0  CL 101  --   --   --   --  110  CO2 16*  --   --   --   --  20*  GLUCOSE 80  --   --   --   --  85  BUN 16  --   --   --   --  16  CREATININE 0.66  --   --   --   --  0.66  CALCIUM 7.9*  --   --   --   --  8.6*  MG  --   --   --   --   --  1.9  PHOS  --   --   --   --   --  3.2   GFR Estimated Creatinine Clearance: 89.5 mL/min (by C-G formula based on SCr of 0.66 mg/dL). Liver Function Tests: Recent Labs  Lab 08/12/23 0902 08/13/23 0534  AST 25 22  ALT 21 18  ALKPHOS 65 59  BILITOT 1.2 1.1  PROT 6.0* 5.7*  ALBUMIN 3.6 3.3*   No results for input(s): "LIPASE", "AMYLASE" in the last 168 hours. No results for input(s): "AMMONIA" in the last 168 hours. Coagulation profile Recent Labs  Lab 08/12/23 0902  INR 1.2    CBC: Recent Labs  Lab 08/12/23 0902 08/13/23 0534  WBC 7.0 4.8  HGB 14.7 13.6  HCT 43.9 40.1  MCV 91.6 90.7  PLT 170 162   Cardiac Enzymes: No results for input(s): "CKTOTAL", "CKMB", "CKMBINDEX", "TROPONINI" in the last 168 hours. BNP (last 3 results) No results for input(s): "PROBNP" in the last 8760 hours. CBG: Recent Labs  Lab 08/12/23 0931  GLUCAP 82   D-Dimer: No results for input(s): "DDIMER" in the last 72 hours. Hgb A1c: No results for input(s): "HGBA1C" in the last 72 hours. Lipid Profile: No results for input(s): "CHOL", "HDL", "LDLCALC", "TRIG", "CHOLHDL", "LDLDIRECT" in the last 72 hours. Thyroid function studies: Recent Labs    08/12/23 1228  TSH 1.370  T4TOTAL 9.9   Anemia work up: No results for input(s): "VITAMINB12", "FOLATE",  "FERRITIN", "TIBC", "IRON", "RETICCTPCT" in the last 72 hours. Sepsis Labs: Recent Labs  Lab 08/12/23 0902 08/13/23 0534  WBC  7.0 4.8    Microbiology Recent Results (from the past 240 hours)  MRSA Next Gen by PCR, Nasal     Status: None   Collection Time: 08/12/23  9:36 AM   Specimen: Nasal Mucosa; Nasal Swab  Result Value Ref Range Status   MRSA by PCR Next Gen NOT DETECTED NOT DETECTED Final    Comment: (NOTE) The GeneXpert MRSA Assay (FDA approved for NASAL specimens only), is one component of a comprehensive MRSA colonization surveillance program. It is not intended to diagnose MRSA infection nor to guide or monitor treatment for MRSA infections. Test performance is not FDA approved in patients less than 40 years old. Performed at [facility], [address] [place], [place] [address]     Procedures and diagnostic studies:  DG Chest [facility] 1 View Result Date: 08/12/2023 CLINICAL DATA:  Atrial fibrillation with rapid ventricular response EXAM: PORTABLE CHEST 1 VIEW COMPARISON:  None Available. FINDINGS: Single frontal view of the chest demonstrates an unremarkable cardiac silhouette. Increased density at the left lateral lung base obscuring portions of the cardiac apex and left costophrenic angle, which could reflect lingular consolidation and/or small effusion. Right chest is clear. No pneumothorax. No acute bony abnormalities. IMPRESSION: 1. Focal lingular consolidation and/or small left effusion. Electronically Signed   By: Sharlet Salina M.D.   On: 08/12/2023 16:43   ECHOCARDIOGRAM COMPLETE Result Date: 08/12/2023    ECHOCARDIOGRAM REPORT   Patient Name:   Curtis Carroll Date of Exam: 08/12/2023 Medical Rec #:  [id number]         Height:       70.0 in Accession #:    [id number]        Weight:       209.2 lb Date of Birth:  08-Nov-1945        BSA:          2.128 m Patient Age:    77 years          BP:           129/92 mmHg Patient Gender: M                 HR:            104 bpm. Exam Location:  [facility] Procedure: 2D Echo, Cardiac Doppler, Color Doppler and Intracardiac            Opacification Agent (Both Spectral and Color Flow Doppler were            utilized during procedure). Indications:     Atrila Fibrillation  History:         Patient has no prior history of Echocardiogram examinations.                  Arrythmias:Atrial Fibrillation; Risk Factors:Hypertension.  Sonographer:     Mikki Harbor Referring Phys:  [phone] Francoise Schaumann NE[place]ON Diagnosing Phys: Lorine Bears MD  Sonographer Comments: Technically difficult study due to poor echo windows and suboptimal subcostal window. IMPRESSIONS  1. Left ventricular ejection fraction, by estimation, is 50 to 55%. The left ventricle has low normal function. Left ventricular endocardial border not optimally defined to evaluate regional wall motion. Left ventricular diastolic parameters are indeterminate.  2. Right ventricular systolic function is normal. The right ventricular size is normal. Tricuspid regurgitation signal is inadequate for assessing PA pressure.  3. The mitral valve is normal in structure. No evidence of mitral valve regurgitation. No evidence of mitral stenosis.  4. The aortic  valve is normal in structure. Aortic valve regurgitation is not visualized. No aortic stenosis is present. FINDINGS  Left Ventricle: Left ventricular ejection fraction, by estimation, is 50 to 55%. The left ventricle has low normal function. Left ventricular endocardial border not optimally defined to evaluate regional wall motion. Definity contrast agent was given IV  to delineate the left ventricular endocardial borders. The left ventricular internal cavity size was normal in size. There is borderline left ventricular hypertrophy. Left ventricular diastolic parameters are indeterminate. Right Ventricle: The right ventricular size is normal. No increase in right ventricular wall thickness. Right ventricular systolic function is normal. Tricuspid  regurgitation signal is inadequate for assessing PA pressure. Left Atrium: Left atrial size was normal in size. Right Atrium: Right atrial size was normal in size. Pericardium: There is no evidence of pericardial effusion. Mitral Valve: The mitral valve is normal in structure. No evidence of mitral valve regurgitation. No evidence of mitral valve stenosis. MV peak gradient, 2.0 mmHg. The mean mitral valve gradient is 1.0 mmHg. Tricuspid Valve: The tricuspid valve is normal in structure. Tricuspid valve regurgitation is not demonstrated. No evidence of tricuspid stenosis. Aortic Valve: The aortic valve is normal in structure. Aortic valve regurgitation is not visualized. No aortic stenosis is present. Aortic valve mean gradient measures 2.0 mmHg. Aortic valve peak gradient measures 4.3 mmHg. Aortic valve area, by VTI measures 2.03 cm. Pulmonic Valve: The pulmonic valve was normal in structure. Pulmonic valve regurgitation is trivial. No evidence of pulmonic stenosis. Aorta: The aortic root is normal in size and structure. Venous: The inferior vena cava was not well visualized. IAS/Shunts: No atrial level shunt detected by color flow Doppler.  LEFT VENTRICLE PLAX 2D LVIDd:         4.70 cm LVIDs:         2.90 cm LV PW:         1.10 cm LV IVS:        1.20 cm LVOT diam:     2.00 cm LV SV:         42 LV SV Index:   20 LVOT Area:     3.14 cm  RIGHT VENTRICLE RV Basal diam:  3.35 cm  PULMONARY VEINS RV Mid diam:    3.60 cm  Systolic Velocity: 1.60 cm/s LEFT ATRIUM             Index        RIGHT ATRIUM           Index LA diam:        4.00 cm 1.88 cm/m   RA Area:     16.80 cm LA Vol (A2C):   32.0 ml 15.04 ml/m  RA Volume:   43.60 ml  20.49 ml/m LA Vol (A4C):   41.7 ml 19.60 ml/m LA Biplane Vol: 38.5 ml 18.09 ml/m  AORTIC VALVE                    PULMONIC VALVE AV Area (Vmax):    2.46 cm     PV Vmax:       1.14 m/s AV Area (Vmean):   2.35 cm     PV Peak grad:  5.2 mmHg AV Area (VTI):     2.03 cm AV Vmax:            103.50 cm/s AV Vmean:          67.200 cm/s AV VTI:  0.206 m AV Peak Grad:      4.3 mmHg AV Mean Grad:      2.0 mmHg LVOT Vmax:         80.90 cm/s LVOT Vmean:        50.200 cm/s LVOT VTI:          0.133 m LVOT/AV VTI ratio: 0.65  AORTA Ao Root diam: 3.80 cm MITRAL VALVE MV Area (PHT): 5.23 cm    SHUNTS MV Area VTI:   3.27 cm    Systemic VTI:  0.13 m MV Peak grad:  2.0 mmHg    Systemic Diam: 2.00 cm MV Mean grad:  1.0 mmHg MV Vmax:       0.71 m/s MV Vmean:      43.7 cm/s MV Decel Time: 145 msec MV E velocity: 65.50 cm/s MV A velocity: 45.60 cm/s MV E/A ratio:  1.44 Lorine Bears MD Electronically signed by Lorine Bears MD Signature Date/Time: 08/12/2023/3:05:26 PM    Final                LOS: 1 day   Celes Dedic  [facility]   Pager on [email or web address]. If 7PM-7AM, please contact night-coverage at [email or web address]     08/13/2023, 9:26 AM

## 2023-08-13 NOTE — Plan of Care (Signed)
  Problem: Education: Goal: Knowledge of General Education information will improve Description: Including pain rating scale, medication(s)/side effects and non-pharmacologic comfort measures Outcome: Progressing   Problem: Health Behavior/Discharge Planning: Goal: Ability to manage health-related needs will improve Outcome: Progressing   Problem: Clinical Measurements: Goal: Ability to maintain clinical measurements within normal limits will improve Outcome: Progressing Goal: Will remain free from infection Outcome: Progressing Goal: Diagnostic test results will improve Outcome: Progressing Goal: Respiratory complications will improve Outcome: Progressing Goal: Cardiovascular complication will be avoided Outcome: Progressing   Problem: Activity: Goal: Risk for activity intolerance will decrease Outcome: Progressing   Problem: Nutrition: Goal: Adequate nutrition will be maintained Outcome: Progressing   Problem: Elimination: Goal: Will not experience complications related to bowel motility Outcome: Progressing Goal: Will not experience complications related to urinary retention Outcome: Progressing   Problem: Pain Managment: Goal: General experience of comfort will improve and/or be controlled Outcome: Progressing   Problem: Safety: Goal: Ability to remain free from injury will improve Outcome: Progressing   Problem: Skin Integrity: Goal: Risk for impaired skin integrity will decrease Outcome: Progressing   Problem: Skin Integrity: Goal: Risk for impaired skin integrity will decrease Outcome: Progressing   Problem: Education: Goal: Knowledge of disease or condition will improve Outcome: Progressing Goal: Understanding of medication regimen will improve Outcome: Progressing Goal: Individualized Educational Video(s) Outcome: Progressing   Problem: Activity: Goal: Ability to tolerate increased activity will improve Outcome: Progressing   Problem:  Cardiac: Goal: Ability to achieve and maintain adequate cardiopulmonary perfusion will improve Outcome: Progressing   Problem: Health Behavior/Discharge Planning: Goal: Ability to safely manage health-related needs after discharge will improve Outcome: Progressing

## 2023-08-13 NOTE — Progress Notes (Signed)
   Patient Name: Curtis Carroll Date of Encounter: 08/13/2023 [facility] Cardiologist: New - Kirke Corin  Interval Summary  .    Feeling well without chest pain, shortness of breath, or palpitations.  Initially thought maybe he could proceed with colonoscopy today, though he does not feel like moving forward at this time.  Vital Signs .    Vitals:   08/13/23 0800 08/13/23 0900 08/13/23 1000 08/13/23 1022  BP: 102/72 119/74 (!) 136/92 (!) 136/92  Pulse: 81 86 (!) 103 (!) 126  Resp: 16 (!) 22 16   Temp: (!) 97 F (36.1 C)     TempSrc:      SpO2: 93% 97% 96%   Weight:      Height:        Intake/Output Summary (Last 24 hours) at 08/13/2023 1354 Last data filed at 08/13/2023 0701 Gross per 24 hour  Intake 1722.15 ml  Output 1100 ml  Net 622.15 ml      08/12/2023    9:30 AM 08/12/2023    7:46 AM 02/18/2023    9:05 AM  Last 3 Weights  Weight (lbs) 209 lb 3.5 oz 210 lb 208 lb  Weight (kg) 94.9 kg 95.255 kg 94.348 kg      Telemetry/ECG    Atrial flutter with variable AV block with ventricular rates 70-110 bpm - Personally Reviewed  Physical Exam .   GEN: No acute distress.   Neck: No JVD Cardiac: iRRR, no murmurs, rubs, or gallops.  Respiratory: Clear to auscultation bilaterally. GI: Soft, nontender, non-distended  MS: No edema  Assessment & Plan .     Atrial flutter with rapid ventricular response: Mr. Curtis Carroll reports a history of arrhythmias and palpitations but does not have documented atrial fibrillation/flutter.  He was noted to be in atrial flutter with mildly elevated ventricular rates yesterday when he arrived for his colonoscopy though rates jumped significantly with induction of anesthesia prompting cancellation of the procedure.  Ventricular rates have improved with metoprolol and diltiazem, the latter of which has been weaned off. -Continue metoprolol to tartrate titration for target resting heart rate less than 100 bpm. -Continue heparin infusion  with transition to apixaban 5 mg twice daily when it is clear that no further invasive procedures are planned in the setting of a CHA2DS2-VASc score of 4. -If patient can be adequately rate controlled, I would favor continued rate control strategy followed by elective cardioversion after completion of at least 3 weeks of therapeutic anticoagulation if he does not spontaneously convert to sinus rhythm.  If we are unable to maintain reasonable heart rate control prior to discharge, TEE-guided cardioversion will need to be considered. -May benefit from outpatient EP consultation to discuss long-term treatment options of atrial flutter. -Okay to transfer to telemetry from my standpoint.  History of colon polyps: Mr. Curtis Carroll reports a history of multiple colon polyps and bleeding following polypectomies.  He was scheduled for follow-up colonoscopy yesterday though this was canceled due to elevated ventricular rates. -Ongoing management per IM/GI. -If recurrent GI bleeding were to be an issue, consultation with EP for consideration of left atrial appendage occlusion would need to be considered.  For questions or updates, please contact [facility] Please consult [email or web address] for contact info under [facility].    Signed, Yvonne Kendall, MD
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
## 2023-08-14 ENCOUNTER — Other Ambulatory Visit (HOSPITAL_COMMUNITY): Payer: Self-pay

## 2023-08-14 ENCOUNTER — Telehealth (HOSPITAL_COMMUNITY): Payer: Self-pay | Admitting: Pharmacy Technician

## 2023-08-14 DIAGNOSIS — I4891 Unspecified atrial fibrillation: Secondary | ICD-10-CM | POA: Diagnosis not present

## 2023-08-14 DIAGNOSIS — I4892 Unspecified atrial flutter: Secondary | ICD-10-CM | POA: Diagnosis not present

## 2023-08-14 DIAGNOSIS — I10 Essential (primary) hypertension: Secondary | ICD-10-CM | POA: Diagnosis not present

## 2023-08-14 DIAGNOSIS — E871 Hypo-osmolality and hyponatremia: Secondary | ICD-10-CM | POA: Diagnosis not present

## 2023-08-14 LAB — CBC
HCT: 41.5 % (ref 39.0–52.0)
Hemoglobin: 14.1 g/dL (ref 13.0–17.0)
MCH: 30.3 pg (ref 26.0–34.0)
MCHC: 34 g/dL (ref 30.0–36.0)
MCV: 89.1 fL (ref 80.0–100.0)
Platelets: 170 10*3/uL (ref 150–400)
RBC: 4.66 MIL/uL (ref 4.22–5.81)
RDW: 14.6 % (ref 11.5–15.5)
WBC: 5.1 10*3/uL (ref 4.0–10.5)
nRBC: 0 % (ref 0.0–0.2)

## 2023-08-14 LAB — HEPARIN LEVEL (UNFRACTIONATED)
Heparin Unfractionated: 0.53 [IU]/mL (ref 0.30–0.70)
Heparin Unfractionated: 0.43 [IU]/mL (ref 0.30–0.70)

## 2023-08-14 MED ORDER — BISACODYL 10 MG RE SUPP: 10.0000 mg | Freq: Every day | RECTAL | Status: DC | PRN

## 2023-08-14 MED ORDER — METOPROLOL TARTRATE 25 MG PO TABS
12.5000 mg | ORAL_TABLET | Freq: Once | ORAL | Status: AC
  Administered 2023-08-14: 12.5 mg via ORAL
  Filled 2023-08-14: qty 1

## 2023-08-14 MED ORDER — VITAMIN D3 25 MCG (1000 UNIT) PO TABS
500.0000 [IU] | ORAL_TABLET | ORAL | Status: DC
  Administered 2023-08-14: 500 [IU] via ORAL
  Filled 2023-08-14: qty 0.5
  Filled 2023-08-14: qty 1

## 2023-08-14 MED ORDER — ATORVASTATIN CALCIUM 10 MG PO TABS
10.0000 mg | ORAL_TABLET | Freq: Every day | ORAL | Status: DC
  Administered 2023-08-14: 10 mg via ORAL
  Filled 2023-08-14: qty 1

## 2023-08-14 MED ORDER — APIXABAN 5 MG PO TABS
5.0000 mg | ORAL_TABLET | Freq: Two times a day (BID) | ORAL | Status: DC
Start: 2023-08-14 — End: 2023-08-15
  Administered 2023-08-14 – 2023-08-15 (×3): 5 mg via ORAL
  Filled 2023-08-14 (×3): qty 1

## 2023-08-14 MED ORDER — SENNOSIDES-DOCUSATE SODIUM 8.6-50 MG PO TABS
1.0000 | ORAL_TABLET | Freq: Two times a day (BID) | ORAL | Status: DC
  Administered 2023-08-14 – 2023-08-15 (×3): 1 via ORAL
  Filled 2023-08-14 (×3): qty 1

## 2023-08-14 MED ORDER — POLYETHYLENE GLYCOL 3350 17 G PO PACK
17.0000 g | PACK | Freq: Two times a day (BID) | ORAL | Status: DC
  Administered 2023-08-15: 17 g via ORAL
  Filled 2023-08-14 (×3): qty 1

## 2023-08-14 MED ORDER — METOPROLOL TARTRATE 50 MG PO TABS
50.0000 mg | ORAL_TABLET | Freq: Two times a day (BID) | ORAL | Status: DC
  Administered 2023-08-14 – 2023-08-15 (×2): 50 mg via ORAL
  Filled 2023-08-14 (×2): qty 1

## 2023-08-14 NOTE — Progress Notes (Signed)
   Patient Name: Curtis Carroll Date of Encounter: 08/14/2023 [facility] Cardiologist: [facility]  Interval Summary  .    Remains in afib/flutter with rates 100-110. He denies chest pain and SOB.  Sitting up in bed, family at the bedside Received metoprolol 37.5 this morning Blood pressure stable 1 20-1 30 systolic  Vital Signs .    Vitals:   08/14/23 0600 08/14/23 0700 08/14/23 0750 08/14/23 0800  BP: 123/77 112/67 110/77 138/89  Pulse: 88 83 80 (!) 55  Resp: (!) 22 18 19  (!) 22  Temp:   (!) 97.5 F (36.4 C)   TempSrc:   Oral   SpO2: 95% 97% 96% 95%  Weight:      Height:        Intake/Output Summary (Last 24 hours) at 08/14/2023 0923 Last data filed at 08/14/2023 9562 Gross per 24 hour  Intake 479.68 ml  Output 850 ml  Net -370.32 ml      08/12/2023    9:30 AM 08/12/2023    7:46 AM 02/18/2023    9:05 AM  Last 3 Weights  Weight (lbs) 209 lb 3.5 oz 210 lb 208 lb  Weight (kg) 94.9 kg 95.255 kg 94.348 kg      Telemetry/ECG    Afib/flutter HR 70-110 - Personally Reviewed  Physical Exam .   Constitutional:  oriented to person, place, and time. No distress.  HENT:  Head: Grossly normal Eyes:  no discharge. No scleral icterus.  Neck: No JVD, no carotid bruits  Cardiovascular: Irregularly irregular, no murmurs appreciated Pulmonary/Chest: Clear to auscultation bilaterally, no wheezes or rails Abdominal: Soft.  no distension.  no tenderness.  Musculoskeletal: Normal range of motion Neurological:  normal muscle tone. Coordination normal. No atrophy Skin: Skin warm and dry Psychiatric: normal affect, pleasant   Assessment & Plan .   Curtis Carroll is a 78 y.o. male with a hx of hypothyroidism, HTN, HLD, GERD, AAA who is being seen 08/12/2023 for the evaluation of new onset Afib RVR    New onset Aflutter with RVR  -presented for routine colonoscopy found to have rapid Afib/flutter on induction. Says prep was extremely difficult, likely very  dehydrated - he reports HR was labile 90-120s prior to procedure. Also reports prior heart problems with colonoscopies  - echo showed LVEF 50-55%, normal RVSF started on IV dilt, transitioned to metoprolol  tartrate Recommend we increase metoprolol to tartrate up to 50 twice daily If rate continues to run fast will add low-dose digoxin Overall asymptomatic - CHADSVASC of (Agex2, HTN, PAD) at least 4.  Will transition heparin infusion to Eliquis 5 twice daily For cardioversion in 3 to 4 weeks time after uninterrupted NOAC  HTN - PTA lisinopril held - metoprolol 50mg  BID   HLD - LDL 73 - Lipitor 10mg  daily  Long discussion with patient and family at the bedside, all questions answered Discussed details of atrial fibrillation, management  For questions or updates, please contact [facility] Please consult [email or web address] for contact info under        Signed, Cadence David Stall, PA-C

## 2023-08-14 NOTE — Telephone Encounter (Signed)
 Patient Product/process development scientist completed.    The patient is insured through [employer]. Patient has Medicare and is not eligible for a copay card, but may be able to apply for patient assistance or Medicare RX Payment Plan (Patient Must reach out to their plan, if eligible for payment plan), if available.    Ran test claim for Eliquis 5 mg and the current 30 day co-pay is $45.00.   This test claim was processed through [facility]- copay amounts may vary at other pharmacies due to pharmacy/plan contracts, or as the patient moves through the different stages of their insurance plan.     Roland Earl, CPHT Pharmacy Technician III Certified Patient Advocate [facility] Pharm[facility] Patient Advocate Team Direct Number: [phone]  Fax: [phone]

## 2023-08-14 NOTE — Progress Notes (Signed)
 [facility]  - [place] [facility]   PATIENT NAME: Curtis Carroll    MR#:  [id number]  DATE OF BIRTH:  April 25, 1946  SUBJECTIVE:  patient overall feeling better. No family at bedside during my evaluation. Heartrate is 98-100. No rectal bleed. Patient will get G.I. workup after few weeks as outpatient. Tolerating PO diet.    VITALS:  Blood pressure 123/85, pulse 69, temperature (!) 97.5 F (36.4 C), temperature source Oral, resp. rate (!) 23, height 5\' 10"  (1.778 m), weight 94.9 kg, SpO2 94%.  PHYSICAL EXAMINATION:   GENERAL:  78 y.o.-year-old patient with no acute distress.  LUNGS: Normal breath sounds bilaterally, no wheezing CARDIOVASCULAR: S1, S2 normal. No murmur   ABDOMEN: Soft, nontender, nondistended. Bowel sounds present.  EXTREMITIES: No  edema b/l.    NEUROLOGIC: nonfocal  patient is alert and awake SKIN: No obvious rash, lesion, or ulcer.   LABORATORY PANEL:  CBC Recent Labs  Lab 08/14/23 0208  WBC 5.1  HGB 14.1  HCT 41.5  PLT 170    Chemistries  Recent Labs  Lab 08/13/23 0534  NA 136  K 4.0  CL 110  CO2 20*  GLUCOSE 85  BUN 16  CREATININE 0.66  CALCIUM 8.6*  MG 1.9  AST 22  ALT 18  ALKPHOS 59  BILITOT 1.1   Assessment and Plan  Curtis Carroll is a 78 y.o. male with medical history significant of hypertension, aortic calcifications, hyperlipidemia, hypothyroidism history of colonic polyps, history of GI bleed post colonoscopy, who presented to the hospital for elective colonoscopy on 08/12/2023.  He had colon prep prior to the planned colonoscopy.  At the endoscopy unit, patient was noted to have atrial flutter with rapid ventricular response.    Atrial flutter/atrial fibrillation with RVR: new onset  --heart rate has improved.  He is off of IV Cardizem drip.   --Continue metoprolol.   --He is on IV heparin drip with plan to transition to oral anticoagulant eliquis per Dr. Mariah Milling -- consider cardioversion after three weeks  if still remains in a fib. Patient will need electrophysiologist to follow-up as outpatient. Will defer to [facility] cardiology -- CHA2DS2-VASc is 4 --2D echo showed EF estimated at 50 to 55%, indeterminate LV diastolic parameters. TSH 1.370.   Hypertension: Lisinopril on hold -- continue metoprolol -- blood pressure stable  Hyponatremia: Improved.  This was probably from colon prep.    Colonic polyps with surveillance colonoscopy: Previously planned elective colonoscopy has been aborted for now.  Discussed with Dr. Mia Creek. Diet has been advanced from liquid diet to heart healthy diet. -- Patient will follow-up G.I. as outpatient at a later date  Hypothyroidism -- continue Synthroid  Hyperlipidemia -- on statins   transfer to cardiac telemetry Procedures: Family communication : none today Consults : [facility] cardiology CODE STATUS: full DVT Prophylaxis : eliquis Level of care: Telemetry Cardiac Status is: Inpatient Remains inpatient appropriate because: will continue to monitor for another day and anticipate discharge tomorrow    TOTAL TIME TAKING CARE OF THIS PATIENT: 45 minutes.  >50% time spent on counselling and coordination of care  Note: This dictation was prepared with Dragon dictation along with smaller phrase technology. Any transcriptional errors that result from this process are unintentional.  Enedina Finner M.D    [facility]   CC: Primary care physician; Adora Fridge, MD
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
## 2023-08-14 NOTE — Plan of Care (Signed)
  Problem: Health Behavior/Discharge Planning: Goal: Ability to manage health-related needs will improve Outcome: Progressing   Problem: Clinical Measurements: Goal: Ability to maintain clinical measurements within normal limits will improve Outcome: Progressing Goal: Diagnostic test results will improve Outcome: Progressing Goal: Respiratory complications will improve Outcome: Progressing Goal: Cardiovascular complication will be avoided Outcome: Progressing   Problem: Activity: Goal: Risk for activity intolerance will decrease Outcome: Progressing   Problem: Nutrition: Goal: Adequate nutrition will be maintained Outcome: Progressing   Problem: Coping: Goal: Level of anxiety will decrease Outcome: Progressing   Problem: Pain Managment: Goal: General experience of comfort will improve and/or be controlled Outcome: Progressing   Problem: Safety: Goal: Ability to remain free from injury will improve Outcome: Progressing

## 2023-08-15 DIAGNOSIS — Z860100 Personal history of colon polyps, unspecified: Secondary | ICD-10-CM

## 2023-08-15 DIAGNOSIS — I4891 Unspecified atrial fibrillation: Secondary | ICD-10-CM | POA: Diagnosis not present

## 2023-08-15 DIAGNOSIS — I10 Essential (primary) hypertension: Secondary | ICD-10-CM | POA: Diagnosis not present

## 2023-08-15 DIAGNOSIS — Z23 Encounter for immunization: Secondary | ICD-10-CM | POA: Diagnosis present

## 2023-08-15 DIAGNOSIS — E871 Hypo-osmolality and hyponatremia: Secondary | ICD-10-CM | POA: Diagnosis not present

## 2023-08-15 MED ORDER — METOPROLOL TARTRATE 50 MG PO TABS: 50.0000 mg | ORAL_TABLET | Freq: Two times a day (BID) | ORAL | 2 refills | Status: DC

## 2023-08-15 MED ORDER — APIXABAN 5 MG PO TABS: 5.0000 mg | ORAL_TABLET | Freq: Two times a day (BID) | ORAL | 2 refills | Status: DC

## 2023-08-15 NOTE — Plan of Care (Signed)

## 2023-08-15 NOTE — Plan of Care (Signed)
  Problem: Health Behavior/Discharge Planning: Goal: Ability to manage health-related needs will improve Outcome: Progressing   Problem: Clinical Measurements: Goal: Ability to maintain clinical measurements within normal limits will improve Outcome: Progressing   Problem: Elimination: Goal: Will not experience complications related to bowel motility Outcome: Progressing   Problem: Health Behavior/Discharge Planning: Goal: Ability to safely manage health-related needs after discharge will improve Outcome: Progressing

## 2023-08-15 NOTE — Discharge Summary (Signed)
 Physician Discharge Summary   Patient: Curtis Carroll MRN: [id number] DOB: 03-29-1946  Admit date:     08/12/2023  Discharge date: 08/15/23  Discharge Physician: Enedina Finner   PCP: Adora Fridge, MD   Recommendations at discharge:    F/u PCP in 1-2 weeks F/u [facility] Dr Mariah Milling in 1-2 weeks  Discharge Diagnoses: Principal Problem:   Atrial fibrillation with RVR (HCC) Active Problems:   Hyponatremia   History of colon polyps   HTN (hypertension)   Hypothyroidism   Atrial flutter with rapid ventricular response (HCC)  Curtis Carroll is a 78 y.o. male with medical history significant of hypertension, aortic calcifications, hyperlipidemia, hypothyroidism history of colonic polyps, history of GI bleed post colonoscopy, who presented to the hospital for elective colonoscopy on 08/12/2023.  He had colon prep prior to the planned colonoscopy.  At the endoscopy unit, patient was noted to have atrial flutter with rapid ventricular response.     Atrial flutter/atrial fibrillation with RVR: new onset  --heart rate has improved.  He is off of IV Cardizem drip.   --Continue metoprolol.   --He is on IV heparin drip with plan to transition to oral anticoagulant eliquis per Dr. Mariah Milling -- consider cardioversion after three weeks if still remains in a fib. Patient will need electrophysiologist to follow-up as outpatient. Will defer to [facility] cardiology -- CHA2DS2-VASc is 4 --2D echo showed EF estimated at 50 to 55%, indeterminate LV diastolic parameters. --TSH 1.370.   Hypertension: Lisinopril on hold -- continue metoprolol -- blood pressure stable   Hyponatremia: Improved.  This was probably from colon prep.    Colonic polyps with surveillance colonoscopy: Previously planned elective colonoscopy has been aborted for now.  Discussed with Dr. Mia Creek. Diet has been advanced from liquid diet to heart healthy diet. -- Patient will follow-up G.I. as outpatient at a later date    Hypothyroidism -- continue Synthroid   Hyperlipidemia -- on statins   Procedures:none Family communication : none today Consults : [facility] cardiology CODE STATUS: full DVT Prophylaxis : eliquis  Pain control - [place] Controlled Substance Reporting System database was reviewed. and patient was instructed, not to drive, operate heavy machinery, perform activities at heights, swimming or participation in water activities or provide baby-sitting services while on Pain, Sleep and Anxiety Medications; until their outpatient Physician has advised to do so again. Also recommended to not to take more than prescribed Pain, Sleep and Anxiety Medications.   Disposition: Home Diet recommendation:  Discharge Diet Orders (From admission, onward)     Start     Ordered   08/15/23 0000  Diet - low sodium heart healthy        08/15/23 0919           Cardiac diet DISCHARGE MEDICATION: Allergies as of 08/15/2023       Reactions   Alcohol Other (See Comments)   Drinking alcohol causes severe migraines   Codeine Nausea Only   Lactose Intolerance (gi) Other (See Comments)   Bloating   Milk (cow) Other (See Comments)   bloating   Other Diarrhea   Black pepper   Penicillins Nausea Only   Oral form only; can tolerate injections        Medication List     PAUSE taking these medications    lisinopril 10 MG tablet Wait to take this until your doctor or other care provider tells you to start again. Until further instructed by PCP or Cardiology Commonly known as:  ZESTRIL Take 10 mg by mouth at bedtime.       TAKE these medications    acetaminophen 500 MG tablet Commonly known as: TYLENOL Take 2 tablets (1,000 mg total) by mouth every 6 (six) hours as needed.   apixaban 5 MG Tabs tablet Commonly known as: ELIQUIS Take 1 tablet (5 mg total) by mouth 2 (two) times daily.   atorvastatin 10 MG tablet Commonly known as: LIPITOR Take 10 mg by mouth at bedtime.    levothyroxine 88 MCG tablet Commonly known as: SYNTHROID Take 88 mcg by mouth daily before breakfast.   metoprolol tartrate 50 MG tablet Commonly known as: LOPRESSOR Take 1 tablet (50 mg total) by mouth 2 (two) times daily.   Vitamin D-3 125 MCG (5000 UT) Tabs Take 1 tablet by mouth every other day.        Follow-up Information     Adora Fridge, MD. Schedule an appointment as soon as possible for a visit in 1 week(s).   Specialty: Family Medicine Contact information: [address] [place] [address] [phone]         Antonieta Iba, MD. Schedule an appointment as soon as possible for a visit in 1 week(s).   Specialty: Cardiology Why: New onset Afib. referral for EP evaluation Contact information: [address] [place] [address] [phone]                Discharge Exam: Ceasar Mons Weights   08/12/23 0746 08/12/23 0930  Weight: 95.3 kg 94.9 kg   A and O x3 Resp creal to auscultation CVS-s1s2 normal irreg irreg Neuro intact  Condition at discharge: fair  The results of significant diagnostics from this hospitalization (including imaging, microbiology, ancillary and laboratory) are listed below for reference.   Imaging Studies: DG Chest Port 1 View Result Date: 08/12/2023 CLINICAL DATA:  Atrial fibrillation with rapid ventricular response EXAM: PORTABLE CHEST 1 VIEW COMPARISON:  None Available. FINDINGS: Single frontal view of the chest demonstrates an unremarkable cardiac silhouette. Increased density at the left lateral lung base obscuring portions of the cardiac apex and left costophrenic angle, which could reflect lingular consolidation and/or small effusion. Right chest is clear. No pneumothorax. No acute bony abnormalities. IMPRESSION: 1. Focal lingular consolidation and/or small left effusion. Electronically Signed   By: Sharlet Salina M.D.   On: 08/12/2023 16:43   ECHOCARDIOGRAM COMPLETE Result Date: 08/12/2023     ECHOCARDIOGRAM REPORT   Patient Name:   Curtis Carroll Date of Exam: 08/12/2023 Medical Rec #:  [id number]         Height:       70.0 in Accession #:    [id number]        Weight:       209.2 lb Date of Birth:  1945/11/10        BSA:          2.128 m Patient Age:    77 years          BP:           129/92 mmHg Patient Gender: M                 HR:           104 bpm. Exam Location:  [facility] Procedure: 2D Echo, Cardiac Doppler, Color Doppler and Intracardiac            Opacification Agent (Both Spectral and Color Flow Doppler were  utilized during procedure). Indications:     Atrila Fibrillation  History:         Patient has no prior history of Echocardiogram examinations.                  Arrythmias:Atrial Fibrillation; Risk Factors:Hypertension.  Sonographer:     Mikki Harbor Referring Phys:  [id number] Francoise Schaumann [place] Diagnosing Phys: Lorine Bears MD  Sonographer Comments: Technically difficult study due to poor echo windows and suboptimal subcostal window. IMPRESSIONS  1. Left ventricular ejection fraction, by estimation, is 50 to 55%. The left ventricle has low normal function. Left ventricular endocardial border not optimally defined to evaluate regional wall motion. Left ventricular diastolic parameters are indeterminate.  2. Right ventricular systolic function is normal. The right ventricular size is normal. Tricuspid regurgitation signal is inadequate for assessing PA pressure.  3. The mitral valve is normal in structure. No evidence of mitral valve regurgitation. No evidence of mitral stenosis.  4. The aortic valve is normal in structure. Aortic valve regurgitation is not visualized. No aortic stenosis is present. FINDINGS  Left Ventricle: Left ventricular ejection fraction, by estimation, is 50 to 55%. The left ventricle has low normal function. Left ventricular endocardial border not optimally defined to evaluate regional wall motion. Definity contrast agent was given IV  to delineate the left  ventricular endocardial borders. The left ventricular internal cavity size was normal in size. There is borderline left ventricular hypertrophy. Left ventricular diastolic parameters are indeterminate. Right Ventricle: The right ventricular size is normal. No increase in right ventricular wall thickness. Right ventricular systolic function is normal. Tricuspid regurgitation signal is inadequate for assessing PA pressure. Left Atrium: Left atrial size was normal in size. Right Atrium: Right atrial size was normal in size. Pericardium: There is no evidence of pericardial effusion. Mitral Valve: The mitral valve is normal in structure. No evidence of mitral valve regurgitation. No evidence of mitral valve stenosis. MV peak gradient, 2.0 mmHg. The mean mitral valve gradient is 1.0 mmHg. Tricuspid Valve: The tricuspid valve is normal in structure. Tricuspid valve regurgitation is not demonstrated. No evidence of tricuspid stenosis. Aortic Valve: The aortic valve is normal in structure. Aortic valve regurgitation is not visualized. No aortic stenosis is present. Aortic valve mean gradient measures 2.0 mmHg. Aortic valve peak gradient measures 4.3 mmHg. Aortic valve area, by VTI measures 2.03 cm. Pulmonic Valve: The pulmonic valve was normal in structure. Pulmonic valve regurgitation is trivial. No evidence of pulmonic stenosis. Aorta: The aortic root is normal in size and structure. Venous: The inferior vena cava was not well visualized. IAS/Shunts: No atrial level shunt detected by color flow Doppler.  LEFT VENTRICLE PLAX 2D LVIDd:         4.70 cm LVIDs:         2.90 cm LV PW:         1.10 cm LV IVS:        1.20 cm LVOT diam:     2.00 cm LV SV:         42 LV SV Index:   20 LVOT Area:     3.14 cm  RIGHT VENTRICLE RV Basal diam:  3.35 cm  PULMONARY VEINS RV Mid diam:    3.60 cm  Systolic Velocity: 1.60 cm/s LEFT ATRIUM             Index        RIGHT ATRIUM           Index LA diam:  4.00 cm 1.88 cm/m   RA Area:      16.80 cm LA Vol (A2C):   32.0 ml 15.04 ml/m  RA Volume:   43.60 ml  20.49 ml/m LA Vol (A4C):   41.7 ml 19.60 ml/m LA Biplane Vol: 38.5 ml 18.09 ml/m  AORTIC VALVE                    PULMONIC VALVE AV Area (Vmax):    2.46 cm     PV Vmax:       1.14 m/s AV Area (Vmean):   2.35 cm     PV Peak grad:  5.2 mmHg AV Area (VTI):     2.03 cm AV Vmax:           103.50 cm/s AV Vmean:          67.200 cm/s AV VTI:            0.206 m AV Peak Grad:      4.3 mmHg AV Mean Grad:      2.0 mmHg LVOT Vmax:         80.90 cm/s LVOT Vmean:        50.200 cm/s LVOT VTI:          0.133 m LVOT/AV VTI ratio: 0.65  AORTA Ao Root diam: 3.80 cm MITRAL VALVE MV Area (PHT): 5.23 cm    SHUNTS MV Area VTI:   3.27 cm    Systemic VTI:  0.13 m MV Peak grad:  2.0 mmHg    Systemic Diam: 2.00 cm MV Mean grad:  1.0 mmHg MV Vmax:       0.71 m/s MV Vmean:      43.7 cm/s MV Decel Time: 145 msec MV E velocity: 65.50 cm/s MV A velocity: 45.60 cm/s MV E/A ratio:  1.44 Lorine Bears MD Electronically signed by Lorine Bears MD Signature Date/Time: 08/12/2023/3:05:26 PM    Final     Microbiology: Results for orders placed or performed during the hospital encounter of 08/12/23  MRSA Next Gen by PCR, Nasal     Status: None   Collection Time: 08/12/23  9:36 AM   Specimen: Nasal Mucosa; Nasal Swab  Result Value Ref Range Status   MRSA by PCR Next Gen NOT DETECTED NOT DETECTED Final    Comment: (NOTE) The GeneXpert MRSA Assay (FDA approved for NASAL specimens only), is one component of a comprehensive MRSA colonization surveillance program. It is not intended to diagnose MRSA infection nor to guide or monitor treatment for MRSA infections. Test performance is not FDA approved in patients less than 79 years old. Performed at [facility] [address] [place], [place] [address]     Labs: CBC: Recent Labs  Lab 08/12/23 0902 08/13/23 0534 08/14/23 0208  WBC 7.0 4.8 5.1  HGB 14.7 13.6 14.1  HCT 43.9 40.1 41.5  MCV 91.6  90.7 89.1  PLT 170 162 170   Basic Metabolic Panel: Recent Labs  Lab 08/12/23 0902 08/12/23 1226 08/12/23 1558 08/12/23 2016 08/13/23 0057 08/13/23 0534  NA 128* 136 134* 138 136 136  K 4.1  --   --   --   --  4.0  CL 101  --   --   --   --  110  CO2 16*  --   --   --   --  20*  GLUCOSE 80  --   --   --   --  85  BUN 16  --   --   --   --  16  CREATININE 0.66  --   --   --   --  0.66  CALCIUM 7.9*  --   --   --   --  8.6*  MG  --   --   --   --   --  1.9  PHOS  --   --   --   --   --  3.2   Liver Function Tests: Recent Labs  Lab 08/12/23 0902 08/13/23 0534  AST 25 22  ALT 21 18  ALKPHOS 65 59  BILITOT 1.2 1.1  PROT 6.0* 5.7*  ALBUMIN 3.6 3.3*   CBG: Recent Labs  Lab 08/12/23 0931  GLUCAP 82    Discharge time spent: greater than 30 minutes.  Signed: Enedina Finner, MD [facility] 08/15/2023

## 2023-08-15 NOTE — Progress Notes (Signed)
 Transition of Care [facility]) - Inpatient Brief Assessment   Patient Details  Name: Curtis Carroll MRN: [id number] Date of Birth: 1945/06/20  Transition of Care [facility]) CM/SW Contact:    Truddie Hidden, RN Phone Number: 08/15/2023, 9:53 AM   Clinical Narrative: TOC continuing to follow patient's progress throughout discharge planning.   Transition of Care Asessment: Insurance and Status: Insurance coverage has been reviewed Patient has primary care physician: Yes   Prior level of function:: Independent Prior/Current Home Services: No current home services Social Drivers of Health Review: SDOH reviewed no interventions necessary Readmission risk has been reviewed: Yes Transition of care needs: no transition of care needs at this time

## 2023-08-15 NOTE — Discharge Instructions (Signed)
 Pt advised to call [facility] GI Dr Mart Piggs office for re-scheduling appt for colonoscopy

## 2023-08-22 NOTE — Progress Notes (Unsigned)
 Electrophysiology Clinic Note    Date:  08/23/2023  Patient ID:  [name], [name] 29-Oct-1945, MRN [id number] PCP:  Adora Fridge, MD  Cardiologist:  None Kirke Corin, End, and [facility] consulted inpatient) Electrophysiologist: None   Discussed the use of AI scribe software for clinical note transcription with the patient, who gave verbal consent to proceed.   Patient Profile    Chief Complaint: Afib follow-up  History of Present Illness: Curtis Carroll is a 78 y.o. male with PMH notable for newly diagnosed AFib, HTN, hypothyroid, h/o GI Bleed; seen today for EP evaluation after newly diagnosed AFib  He was scheduled for outpatient colonoscopy and found to be in afib w RVR on 3/17 in pre-procedural area. He was initially started on hep gtt, transitioned to eliquis at discharge. Rates controlled with lopressor 50mg  BID.   On follow-up today, he has felt more fatigued since discharge from hospital. He has a [place] mobile that continues to read AFib every time he checks his heart rhythm. Rates are staying 80-90 at rest, up to 130s with prolonged activity but does not sustain at that high once he rests.  He has continued to take eliquis BID, no missed doses, no bleeding concerns.   He denies chest pain, chest pressure, SOB, LH, or presyncope.    Of note, his mother, brother, and daughter all have afib.     Arrhythmia/Device History No specialty comments available.    ROS:  Please see the history of present illness. All other systems are reviewed and otherwise negative.    Physical Exam    VS:  BP (!) 143/94 (BP Location: Left Arm, Patient Position: Sitting, Cuff Size: Normal)   Pulse 84   Ht 5\' 10"  (1.778 m)   Wt 212 lb 12.8 oz (96.5 kg)   SpO2 95%   BMI 30.53 kg/m  BMI: Body mass index is 30.53 kg/m.  Wt Readings from Last 3 Encounters:  08/23/23 212 lb 12.8 oz (96.5 kg)  08/12/23 209 lb 3.5 oz (94.9 kg)  02/18/23 208 lb (94.3 kg)     GEN- The patient  is well appearing, alert and oriented x 3 today.   Lungs- Clear to ausculation bilaterally, normal work of breathing.  Heart- Irregularly irregular rate and rhythm, no murmurs, rubs or gallops Extremities- No peripheral edema, warm, dry    Studies Reviewed   Previous EP, cardiology notes.    EKG is ordered. Personal review of EKG from today shows:    EKG Interpretation Date/Time:  Friday August 23 2023 10:12:02 EDT Ventricular Rate:  84 PR Interval:    QRS Duration:  72 QT Interval:  388 QTC Calculation: 458 R Axis:   -2  Text Interpretation: Atrial flutter with variable A-V block Confirmed by Sherie Don [id number]) on 08/23/2023 10:31:49 AM    TTE, 08/12/2023  1. Left ventricular ejection fraction, by estimation, is 50 to 55%. The left ventricle has low normal function. Left ventricular endocardial border not optimally defined to evaluate regional wall motion. Left ventricular diastolic parameters are indeterminate.   2. Right ventricular systolic function is normal. The right ventricular size is normal. Tricuspid regurgitation signal is inadequate for assessing PA pressure.   3. The mitral valve is normal in structure. No evidence of mitral valve regurgitation. No evidence of mitral stenosis.   4. The aortic valve is normal in structure. Aortic valve regurgitation is not visualized. No aortic stenosis is present.    Assessment and Plan     #)  parox AFib #) aflutter Recent episode identified prior to colonoscopy after completing a difficult prep Ventricular rates appear well-controlled, HDS Continue 50mg  lopressor BID Recommended DCCV once he has been on [facility] for 4 weeks, so 4/19 or after.  If patient develops chest pain, significant SOB, or activity intolerance, recommended he notify office so can adjust to TEE/DCCV sooner. Patient agreeable to this Update BMP, CBC prior to procedure  #) Hypercoag d/t parox afib CHA2DS2-VASc Score = at least 3 [CHF History: 0, HTN History:  1, Diabetes History: 0, Stroke History: 0, Vascular Disease History: 0, Age Score: 2, Gender Score: 0].  Therefore, the patient's annual risk of stroke is 3.2 %.    Stroke ppx - 5mg  eliquis BID, appropriately dosed No bleeding concerns  #) HTN Elevated today in clinic Reassess when in sinus     Informed Consent   Shared Decision Making/Informed Consent The risks (stroke, cardiac arrhythmias rarely resulting in the need for a temporary or permanent pacemaker, skin irritation or burns and complications associated with conscious sedation including aspiration, arrhythmia, respiratory failure and death), benefits (restoration of normal sinus rhythm) and alternatives of a direct current cardioversion were explained in detail to [name] and he agrees to proceed.        Current medicines are reviewed at length with the patient today.   The patient does not have concerns regarding his medicines.  The following changes were made today:  none  Labs/ tests ordered today include:  Orders Placed This Encounter  Procedures   Basic metabolic panel with GFR   CBC   EKG 12-Lead     Disposition: Follow up with EP APP  as usual post procedure   Signed, Sherie Don, NP  08/23/23  11:59 AM  Electrophysiology [facility]

## 2023-08-22 NOTE — H&P (View-Only) (Signed)
 Electrophysiology Clinic Note    Date:  08/23/2023  Patient ID:  Curtis Carroll 29-Oct-1945, MRN [id number] PCP:  Adora Fridge, MD  Cardiologist:  None Kirke Corin, End, and [facility] consulted inpatient) Electrophysiologist: None   Discussed the use of AI scribe software for clinical note transcription with the patient, who gave verbal consent to proceed.   Patient Profile    Chief Complaint: Afib follow-up  History of Present Illness: Curtis Carroll is a 78 y.o. male with PMH notable for newly diagnosed AFib, HTN, hypothyroid, h/o GI Bleed; seen today for EP evaluation after newly diagnosed AFib  He was scheduled for outpatient colonoscopy and found to be in afib w RVR on 3/17 in pre-procedural area. He was initially started on hep gtt, transitioned to eliquis at discharge. Rates controlled with lopressor 50mg  BID.   On follow-up today, he has felt more fatigued since discharge from hospital. He has a [place] mobile that continues to read AFib every time he checks his heart rhythm. Rates are staying 80-90 at rest, up to 130s with prolonged activity but does not sustain at that high once he rests.  He has continued to take eliquis BID, no missed doses, no bleeding concerns.   He denies chest pain, chest pressure, SOB, LH, or presyncope.    Of note, his mother, brother, and daughter all have afib.     Arrhythmia/Device History No specialty comments available.    ROS:  Please see the history of present illness. All other systems are reviewed and otherwise negative.    Physical Exam    VS:  BP (!) 143/94 (BP Location: Left Arm, Patient Position: Sitting, Cuff Size: Normal)   Pulse 84   Ht 5\' 10"  (1.778 m)   Wt 212 lb 12.8 oz (96.5 kg)   SpO2 95%   BMI 30.53 kg/m  BMI: Body mass index is 30.53 kg/m.  Wt Readings from Last 3 Encounters:  08/23/23 212 lb 12.8 oz (96.5 kg)  08/12/23 209 lb 3.5 oz (94.9 kg)  02/18/23 208 lb (94.3 kg)     GEN- The patient  is well appearing, alert and oriented x 3 today.   Lungs- Clear to ausculation bilaterally, normal work of breathing.  Heart- Irregularly irregular rate and rhythm, no murmurs, rubs or gallops Extremities- No peripheral edema, warm, dry    Studies Reviewed   Previous EP, cardiology notes.    EKG is ordered. Personal review of EKG from today shows:    EKG Interpretation Date/Time:  Friday August 23 2023 10:12:02 EDT Ventricular Rate:  84 PR Interval:    QRS Duration:  72 QT Interval:  388 QTC Calculation: 458 R Axis:   -2  Text Interpretation: Atrial flutter with variable A-V block Confirmed by Sherie Don [id number]) on 08/23/2023 10:31:49 AM    TTE, 08/12/2023  1. Left ventricular ejection fraction, by estimation, is 50 to 55%. The left ventricle has low normal function. Left ventricular endocardial border not optimally defined to evaluate regional wall motion. Left ventricular diastolic parameters are indeterminate.   2. Right ventricular systolic function is normal. The right ventricular size is normal. Tricuspid regurgitation signal is inadequate for assessing PA pressure.   3. The mitral valve is normal in structure. No evidence of mitral valve regurgitation. No evidence of mitral stenosis.   4. The aortic valve is normal in structure. Aortic valve regurgitation is not visualized. No aortic stenosis is present.    Assessment and Plan     #)  parox AFib #) aflutter Recent episode identified prior to colonoscopy after completing a difficult prep Ventricular rates appear well-controlled, HDS Continue 50mg  lopressor BID Recommended DCCV once he has been on [facility] for 4 weeks, so 4/19 or after.  If patient develops chest pain, significant SOB, or activity intolerance, recommended he notify office so can adjust to TEE/DCCV sooner. Patient agreeable to this Update BMP, CBC prior to procedure  #) Hypercoag d/t parox afib CHA2DS2-VASc Score = at least 3 [CHF History: 0, HTN History:  1, Diabetes History: 0, Stroke History: 0, Vascular Disease History: 0, Age Score: 2, Gender Score: 0].  Therefore, the patient's annual risk of stroke is 3.2 %.    Stroke ppx - 5mg  eliquis BID, appropriately dosed No bleeding concerns  #) HTN Elevated today in clinic Reassess when in sinus     Informed Consent   Shared Decision Making/Informed Consent The risks (stroke, cardiac arrhythmias rarely resulting in the need for a temporary or permanent pacemaker, skin irritation or burns and complications associated with conscious sedation including aspiration, arrhythmia, respiratory failure and death), benefits (restoration of normal sinus rhythm) and alternatives of a direct current cardioversion were explained in detail to Curtis Carroll and he agrees to proceed.        Current medicines are reviewed at length with the patient today.   The patient does not have concerns regarding his medicines.  The following changes were made today:  none  Labs/ tests ordered today include:  Orders Placed This Encounter  Procedures   Basic metabolic panel with GFR   CBC   EKG 12-Lead     Disposition: Follow up with EP APP  as usual post procedure   Signed, Sherie Don, NP  08/23/23  11:59 AM  Electrophysiology [facility]

## 2023-08-23 ENCOUNTER — Ambulatory Visit: Attending: Cardiology | Admitting: Cardiology

## 2023-08-23 ENCOUNTER — Encounter: Payer: Self-pay | Admitting: Cardiology

## 2023-08-23 VITALS — BP 143/94 | HR 84 | Ht 70.0 in | Wt 212.8 lb

## 2023-08-23 DIAGNOSIS — I4892 Unspecified atrial flutter: Secondary | ICD-10-CM | POA: Insufficient documentation

## 2023-08-23 DIAGNOSIS — I10 Essential (primary) hypertension: Secondary | ICD-10-CM | POA: Insufficient documentation

## 2023-08-23 DIAGNOSIS — I4891 Unspecified atrial fibrillation: Secondary | ICD-10-CM | POA: Diagnosis present

## 2023-08-23 DIAGNOSIS — D6869 Other thrombophilia: Secondary | ICD-10-CM | POA: Diagnosis present

## 2023-08-23 NOTE — Patient Instructions (Signed)
 Medication Instructions:  The current medical regimen is effective;  continue present plan and medications as directed. Please refer to the Current Medication list given to you today.   *If you need a refill on your cardiac medications before your next appointment, please call your pharmacy*  Lab Work: Your provider would like for you to return in before upcoming procedure to have the following labs drawn: CBC, BMET.   Please go to [facility] [address] ([facility]) [address], [place] [address] You do not need an appointment.  They are open from 8 am- 4:30 pm.  Lunch from 1:00 pm- 2:00 pm You do not need to be fasting.   If you have labs (blood work) drawn today and your tests are completely normal, you will receive your results only by: MyChart Message (if you have MyChart) OR A paper copy in the mail If you have any lab test that is abnormal or we need to change your treatment, we will call you to review the results.  Testing/Procedures: Your physician has recommended that you have a Cardioversion (DCCV). Electrical Cardioversion uses a jolt of electricity to your heart either through paddles or wired patches attached to your chest. This is a controlled, usually prescheduled, procedure. Defibrillation is done under light anesthesia in the hospital, and you usually go home the day of the procedure. This is done to get your heart back into a normal rhythm. You are not awake for the procedure. Please see the instruction sheet given to you today.   Follow-Up: At [facility], you and your health needs are our priority.  As part of our continuing mission to provide you with exceptional heart care, our providers are all part of one team.  This team includes your primary Cardiologist (physician) and Advanced Practice Providers or APPs (Physician Assistants and Nurse Practitioners) who all work together to provide you with the care you need, when you need  it.  Your next appointment:   2-3 week(s) after Cardioversion  Provider:   Sherie Don, NP    We recommend signing up for the patient portal called "MyChart".  Sign up information is provided on this After Visit Summary.  MyChart is used to connect with patients for Virtual Visits (Telemedicine).  Patients are able to view lab/test results, encounter notes, upcoming appointments, etc.  Non-urgent messages can be sent to your provider as well.   To learn more about what you can do with MyChart, go to [email or web address].   Other Instructions     Dear Curtis Carroll  You are scheduled for a Cardioversion on Tuesday, April 22 with Dr. Mariah Milling.  Please arrive at the [facility] Entrance of [facility], [address], [place] [address] at 6:30 AM (This is 1 hour(s) prior to your procedure time).  Proceed to the Check-In Desk directly inside the entrance.  Procedure Parking: Use the entrance off of the [address] side of the hospital. Turn right upon entering and follow the driveway to parking that is directly in front of the [facility] V[facility] Center. There is no valet parking available at this entrance, however there is an awning directly in front of the [facility] Center for drop off/ pick up for patients.    DIET:  Nothing to eat or drink after midnight except a sip of water with medications (see medication instructions below)  MEDICATION INSTRUCTIONS: !!IF ANY NEW MEDICATIONS ARE STARTED AFTER TODAY, PLEASE NOTIFY YOUR PROVIDER AS SOON  AS POSSIBLE!!  FYI: Medications such as Semaglutide (Ozempic, [place]), Tirzepatide (Mounjaro, Zepbound), Dulaglutide (Trulicity), etc ("GLP1 agonists") AND Canagliflozin (Invokana), Dapagliflozin (Farxiga), Empagliflozin (Jardiance), Ertugliflozin (Steglatro), Bexagliflozin [employer]) or any combination with one of these drugs such as Invokamet (Canagliflozin/Metformin), Synjardy (Empagliflozin/Metformin), etc ("SGLT2 inhibitors")  must be held around the time of a procedure. This is not a comprehensive list of all of these drugs. Please review all of your medications and talk to your provider if you take any one of these. If you are not sure, ask your provider.     Continue taking your anticoagulant (blood thinner): Apixaban (Eliquis).  You will need to continue this after your procedure until you are told by your provider that it is safe to stop.    LABS: CBC, BMET- at your choice of [facility].   FYI:  For your safety, and to allow [place] to monitor your vital signs accurately during the surgery/procedure we request: If you have artificial nails, gel coating, SNS etc, please have those removed prior to your surgery/procedure. Not having the nail coverings /polish removed may result in cancellation or delay of your surgery/procedure.  Your support person will be asked to wait in the waiting room during your procedure.  It is OK to have someone drop you off and come back when you are ready to be discharged.  You cannot drive after the procedure and will need someone to drive you home.  Bring your insurance cards.  *Special Note: Every effort is made to have your procedure done on time. Occasionally there are emergencies that occur at the hospital that may cause delays. Please be patient if a delay does occur.

## 2023-08-27 LAB — CBC
Hematocrit: 44 % (ref 37.5–51.0)
Hemoglobin: 14.4 g/dL (ref 13.0–17.7)
MCH: 30.7 pg (ref 26.6–33.0)
MCHC: 32.7 g/dL (ref 31.5–35.7)
MCV: 94 fL (ref 79–97)
Platelets: 243 10*3/uL (ref 150–450)
RBC: 4.69 x10E6/uL (ref 4.14–5.80)
RDW: 14.6 % (ref 11.6–15.4)
WBC: 4.8 10*3/uL (ref 3.4–10.8)

## 2023-08-27 LAB — BASIC METABOLIC PANEL WITH GFR
BUN/Creatinine Ratio: 16 (ref 10–24)
BUN: 13 mg/dL (ref 8–27)
CO2: 20 mmol/L (ref 20–29)
Calcium: 9.1 mg/dL (ref 8.6–10.2)
Chloride: 106 mmol/L (ref 96–106)
Creatinine, Ser: 0.81 mg/dL (ref 0.76–1.27)
Glucose: 94 mg/dL (ref 70–99)
Potassium: 4.7 mmol/L (ref 3.5–5.2)
Sodium: 139 mmol/L (ref 134–144)
eGFR: 91 mL/min/{1.73_m2} (ref >59)

## 2023-09-16 MED ORDER — SODIUM CHLORIDE 0.9 % IV SOLN: INTRAVENOUS | Status: DC

## 2023-09-17 ENCOUNTER — Encounter
Admission: RE | Disposition: A | Discharge status: Home / Self Care | Payer: Self-pay | Source: Home / Self Care | Attending: Cardiovascular Disease

## 2023-09-17 ENCOUNTER — Encounter: Payer: Self-pay | Admitting: Cardiovascular Disease

## 2023-09-17 ENCOUNTER — Ambulatory Visit: Admitting: Anesthesiology

## 2023-09-17 ENCOUNTER — Ambulatory Visit
Admission: RE | Admit: 2023-09-17 | Discharge: 2023-09-17 | Disposition: A | Discharge status: Home / Self Care | Attending: Cardiovascular Disease | Admitting: Cardiovascular Disease

## 2023-09-17 DIAGNOSIS — Z7901 Long term (current) use of anticoagulants: Secondary | ICD-10-CM | POA: Diagnosis not present

## 2023-09-17 DIAGNOSIS — E039 Hypothyroidism, unspecified: Secondary | ICD-10-CM | POA: Diagnosis not present

## 2023-09-17 DIAGNOSIS — K449 Diaphragmatic hernia without obstruction or gangrene: Secondary | ICD-10-CM | POA: Insufficient documentation

## 2023-09-17 DIAGNOSIS — Z87891 Personal history of nicotine dependence: Secondary | ICD-10-CM | POA: Diagnosis not present

## 2023-09-17 DIAGNOSIS — I10 Essential (primary) hypertension: Secondary | ICD-10-CM | POA: Diagnosis not present

## 2023-09-17 DIAGNOSIS — R0602 Shortness of breath: Secondary | ICD-10-CM

## 2023-09-17 DIAGNOSIS — I4892 Unspecified atrial flutter: Secondary | ICD-10-CM | POA: Insufficient documentation

## 2023-09-17 DIAGNOSIS — I4891 Unspecified atrial fibrillation: Secondary | ICD-10-CM | POA: Insufficient documentation

## 2023-09-17 HISTORY — PX: CARDIOVERSION: SHX1299

## 2023-09-17 SURGERY — CARDIOVERSION
Anesthesia: General

## 2023-09-17 MED ORDER — LIDOCAINE HCL (CARDIAC) PF 100 MG/5ML IV SOSY
PREFILLED_SYRINGE | INTRAVENOUS | Status: DC | PRN
  Administered 2023-09-17: 100 mg via INTRAVENOUS

## 2023-09-17 MED ORDER — PROPOFOL 10 MG/ML IV BOLUS
INTRAVENOUS | Status: DC | PRN
  Administered 2023-09-17: 50 mg via INTRAVENOUS

## 2023-09-17 NOTE — CV Procedure (Signed)
 Cardioversion procedure note For atrial fibrillation/flutter, persistent.  Procedure Details:  Consent: Risks of procedure as well as the alternatives and risks of each were explained to the (patient/caregiver).  Consent for procedure obtained.  Time Out: Verified patient identification, verified procedure, site/side was marked, verified correct patient position, special equipment/implants available, medications/allergies/relevent history reviewed, required imaging and test results available.  Performed  Patient placed on cardiac monitor, pulse oximetry, supplemental oxygen as necessary.   Sedation given: propofol  IV, Dr. Glyn Laser Pacer pads placed anterior and posterior chest.   Cardioverted 1 time(s).   Cardioverted at  150 J. Synchronized biphasic Converted to NSR   Evaluation: Findings: Post procedure EKG shows: NSR Complications: None Patient did tolerate procedure well.  Time Spent Directly with the Patient:  45 minutes   Tim Monserrate Blaschke, M.D., Ph.D.

## 2023-09-17 NOTE — Transfer of Care (Signed)
 Immediate Anesthesia Transfer of Care Note  Patient: Curtis Carroll  Procedure(s) Performed: CARDIOVERSION  Patient Location:  [facility]  Anesthesia Type:General  Level of Consciousness: drowsy and patient cooperative  Airway & Oxygen Therapy: Patient Spontanous Breathing and Patient connected to nasal cannula oxygen  Post-op Assessment: Report given to RN and Post -op Vital signs reviewed and stable  Post vital signs: stable  Last Vitals:  Vitals Value Taken Time  BP    Temp    Pulse 93 09/17/23 0717  Resp 22 09/17/23 0717  SpO2 95 % 09/17/23 0717  Vitals shown include unfiled device data.  Last Pain:  Vitals:   09/17/23 0705  TempSrc: Oral  PainSc: 0-No pain         Complications: No notable events documented.

## 2023-09-17 NOTE — Anesthesia Postprocedure Evaluation (Signed)
 Anesthesia Post Note  Patient: Curtis Carroll  Procedure(s) Performed: CARDIOVERSION  Patient location during evaluation: PACU Anesthesia Type: General Level of consciousness: awake and alert, oriented and patient cooperative Pain management: pain level controlled Vital Signs Assessment: post-procedure vital signs reviewed and stable Respiratory status: spontaneous breathing, nonlabored ventilation and respiratory function stable Cardiovascular status: blood pressure returned to baseline and stable Postop Assessment: adequate PO intake Anesthetic complications: no   There were no known notable events for this encounter.   Last Vitals:  Vitals:   09/17/23 0800 09/17/23 0815  BP: 104/73 109/77  Pulse: 63 61  Resp: 18 18  Temp:    SpO2: 93% 94%    Last Pain:  Vitals:   09/17/23 0815  TempSrc:   PainSc: 0-No pain                 Franchesca Veneziano

## 2023-09-17 NOTE — Anesthesia Preprocedure Evaluation (Addendum)
 Anesthesia Evaluation  Patient identified by MRN, date of birth, ID band Patient awake    Reviewed: Allergy & Precautions, NPO status , Patient's Chart, lab work & pertinent test results  History of Anesthesia Complications Negative for: history of anesthetic complications  Airway Mallampati: IV   Neck ROM: Full    Dental no notable dental hx.    Pulmonary former smoker (quit 2016)   Pulmonary exam normal breath sounds clear to auscultation       Cardiovascular hypertension, + dysrhythmias (on Eliquis ) Atrial Fibrillation  Rhythm:Irregular Rate:Normal     Neuro/Psych negative neurological ROS     GI/Hepatic hiatal hernia,,,  Endo/Other  Hypothyroidism  Prediabetes   Renal/GU negative Renal ROS     Musculoskeletal  (+) Arthritis ,    Abdominal   Peds  Hematology negative hematology ROS (+)   Anesthesia Other Findings   Reproductive/Obstetrics                             Anesthesia Physical Anesthesia Plan  ASA: 3  Anesthesia Plan: General   Post-op Pain Management:    Induction: Intravenous  PONV Risk Score and Plan: 2 and Propofol  infusion, TIVA and Treatment may vary due to age or medical condition  Airway Management Planned: Natural Airway  Additional Equipment:   Intra-op Plan:   Post-operative Plan:   Informed Consent: I have reviewed the patients History and Physical, chart, labs and discussed the procedure including the risks, benefits and alternatives for the proposed anesthesia with the patient or authorized representative who has indicated his/her understanding and acceptance.       Plan Discussed with: CRNA  Anesthesia Plan Comments: (LMA/GETA backup discussed.  Patient consented for risks of anesthesia including but not limited to:  - adverse reactions to medications - damage to eyes, teeth, lips or other oral mucosa - nerve damage due to positioning  -  sore throat or hoarseness - damage to heart, brain, nerves, lungs, other parts of body or loss of life  Informed patient about role of CRNA in peri- and intra-operative care.  Patient voiced understanding.)       Anesthesia Quick Evaluation

## 2023-09-18 ENCOUNTER — Encounter: Payer: Self-pay | Admitting: Cardiovascular Disease

## 2023-09-20 ENCOUNTER — Telehealth: Payer: Self-pay | Admitting: Cardiology

## 2023-09-20 ENCOUNTER — Encounter: Payer: Self-pay | Admitting: Student

## 2023-09-20 ENCOUNTER — Ambulatory Visit: Attending: Student | Admitting: Student

## 2023-09-20 VITALS — BP 138/76 | HR 44 | Ht 70.0 in | Wt 218.6 lb

## 2023-09-20 DIAGNOSIS — I480 Paroxysmal atrial fibrillation: Secondary | ICD-10-CM | POA: Insufficient documentation

## 2023-09-20 DIAGNOSIS — I4891 Unspecified atrial fibrillation: Secondary | ICD-10-CM | POA: Diagnosis not present

## 2023-09-20 DIAGNOSIS — R0609 Other forms of dyspnea: Secondary | ICD-10-CM | POA: Insufficient documentation

## 2023-09-20 MED ORDER — METOPROLOL TARTRATE 25 MG PO TABS: 25.0000 mg | ORAL_TABLET | Freq: Two times a day (BID) | ORAL | 1 refills | Status: DC

## 2023-09-20 MED ORDER — FUROSEMIDE 20 MG PO TABS: 20.0000 mg | ORAL_TABLET | Freq: Every day | ORAL | 1 refills | Status: DC | PRN

## 2023-09-20 NOTE — Telephone Encounter (Signed)
 Spoke with patient and he reports that his shortness of breath has worsened since Tuesday with some dizziness. He did have DCCV on 09/17/23. Scheduled him to come in and see our APP this morning. He verbalized understanding with no further questions at this time.

## 2023-09-20 NOTE — Patient Instructions (Signed)
 Medication Instructions:  Decrease Metoprolol  to 25 mg twice daily   Take Lasix 20 mg daily for 3 days, then decrease to AS NEEDED (weight gain of 3lbs in a day, or 5lbs in a week) - use weight log provided to record your daily weights.   *If you need a refill on your cardiac medications before your next appointment, please call your pharmacy*  Follow-Up: At [facility], you and your health needs are our priority.  As part of our continuing mission to provide you with exceptional heart care, our providers are all part of one team.  This team includes your primary Cardiologist (physician) and Advanced Practice Providers or APPs (Physician Assistants and Nurse Practitioners) who all work together to provide you with the care you need, when you need it.  Your next appointment:   Keep scheduled appointment

## 2023-09-20 NOTE — Telephone Encounter (Signed)
 Pt c/o Shortness Of Breath: STAT if SOB developed within the last 24 hours or pt is noticeably SOB on the phone  1. Are you currently SOB (can you hear that pt is SOB on the phone)? Yes some, had Cardioversion on 09-17-23- seems to be worse since Cardioversion  2. How long have you been experiencing SOB? Worse since Tuesday  3. Are you SOB when sitting or when up moving around? both, worse when he moves around  4. Are you currently experiencing any other symptoms? A little dizzy when he really gets short of breath

## 2023-09-20 NOTE — Interval H&P Note (Signed)
 History and Physical Interval Note:  09/20/2023 5:48 PM  Curtis Carroll  has presented today for surgery, with the diagnosis of Cardioversion   Afib.  The various methods of treatment have been discussed with the patient and family. After consideration of risks, benefits and other options for treatment, the patient has consented to  Procedure(s): CARDIOVERSION (N/A) as a surgical intervention.  The patient's history has been reviewed, patient examined, no change in status, stable for surgery.  I have reviewed the patient's chart and labs.  Questions were answered to the patient's satisfaction.     [name]

## 2023-09-20 NOTE — H&P (Signed)

## 2023-09-20 NOTE — Progress Notes (Signed)
 Cardiology Clinic Note   Date: 09/20/2023 ID: Curtis Carroll, DOB 1945-06-11, MRN [id number]  Primary Cardiologist:  None  Chief Complaint   Curtis Carroll is a 78 y.o. male who presents to the clinic today for evaluation of shortness of breath.   Patient Profile   Curtis Carroll is followed by cardiology for the history outlined below.      Past medical history significant for: PAF/a-flutter. Onset March 2025 in the setting of colonoscopy. Echo 08/12/2023: EF 50 to 55%.  Indeterminate diastolic parameters.  Normal RV size/function. DCCV 09/17/2023. Hypertension. Hypothyroidism.  In summary, patient presented for elective colonoscopy on 08/12/2023 and was found to be tachycardic with EKG demonstrating A-fib with RVR.  He reported prep prior to colonoscopy was very difficult.  The procedure was canceled and he was started on diltiazem  drip.  He denied any cardiac awareness of arrhythmias.  He was transition to oral metoprolol  and started on Eliquis .  He was evaluated in the office by Adaline Holly, NP on 08/23/2023 and reported fatigue since hospital discharge.  He has a Optician, dispensing mobile at home and reported continued readings of A-fib with rates in the 80 to 90s at rest and up to 130s with prolonged activity.  He underwent DCCV on 09/17/2023.     History of Present Illness    Today, patient contacted the office with reports of increased shortness of breath for the last 3 days.  He also reports some associated dizziness.  He is accompanied by his wife.  He reports increased dyspnea with minimal exertion since cardioversion 3 days ago.  He reports if he is sitting doing nothing he feels "almost normal."  But any movement causes him to feel dyspneic and fatigued.  He denies orthopnea or PND.  He has no cardiac awareness of A-fib and denies palpitations.  Heart rate at home has been in the mid 40s to 50s.  He denies lower extremity edema but has noted abdominal fullness and distention  along with early satiety.  He does not weigh at home.  He denies chest pain, tightness, or pressure.  Prior to DCCV he stayed active using his stationary bicycle and doing exercises for his hip that he learned in PT.  He also does a lot of yard work.  He has not been able to participate in any of these activities secondary to dyspnea.    ROS: All other systems reviewed and are otherwise negative except as noted in History of Present Illness.  EKGs/Labs Reviewed    EKG Interpretation Date/Time:  Friday September 20 2023 10:48:41 EDT Ventricular Rate:  44 PR Interval:  184 QRS Duration:  88 QT Interval:  474 QTC Calculation: 405 R Axis:   -2  Text Interpretation: Marked sinus bradycardia When compared with ECG of 17-Sep-2023 07:30, PREVIOUS ECG IS PRESENT Confirmed by Morey Ar [phone]) on 09/20/2023 11:00:10 AM   08/13/2023: ALT 18; AST 22 08/26/2023: BUN 13; Creatinine, Ser 0.81; Potassium 4.7; Sodium 139   08/26/2023: Hemoglobin 14.4; WBC 4.8   08/12/2023: TSH 1.370   Risk Assessment/Calculations     CHA2DS2-VASc Score = 3   This indicates a 3.2% annual risk of stroke. The patient's score is based upon: CHF History: 0 HTN History: 1 Diabetes History: 0 Stroke History: 0 Vascular Disease History: 0 Age Score: 2 Gender Score: 0             Physical Exam    VS:  BP 138/76   Pulse (!) 44  Ht 5\' 10"  (1.778 m)   Wt 218 lb 9.6 oz (99.2 kg)   SpO2 95%   BMI 31.37 kg/m  , BMI Body mass index is 31.37 kg/m.  GEN: Well nourished, well developed, in no acute distress. Neck: No JVD or carotid bruits. Cardiac:  RRR. No murmurs. No rubs or gallops.   Respiratory:  Respirations regular and unlabored. Clear to auscultation without rales, wheezing or rhonchi. GI: Soft, nontender. Abdomen appears distended.  Extremities: Radials/DP/PT 2+ and equal bilaterally. No clubbing or cyanosis. 1+ pitting edema left lower extremity. 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    Skin: Warm and dry, no rash. Neuro: Strength intact.  Assessment & Plan   PAF/a-flutter S/p DCCV 09/17/2023.  EKG today shows sinus bradycardia with heart rate 44 bpm.  He reports home heart rate has been in the mid 40s to 50s.  He does not have cardiac awareness of arrhythmia. - Decrease metoprolol  to tartrate to 25 mg twice daily.  DOE Patient reports increased dyspnea with minimal exertion x 3 days.  He reports if he is sitting still he feels "almost normal" but any movement causes him to feel dyspneic and fatigued.  He denies lower extremity edema, orthopnea, PND.  He does endorse abdominal distention/fullness with early satiety.  He does not weigh at home but weight in our system is 6 pounds up.  Lungs are clear to auscultation.  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  Abdomen appears distended. - Lasix 20 mg x 3 days then as needed thereafter. - Weigh daily.  Weight log provided. - BMP in 1 week. - Keep follow-up visit with Suzann Riddle, NP on 5/8.  Disposition: Lasix 20 mg x 3 days then as needed thereafter.  Weigh daily.  Weight log provided.  BMP in 1 week.  Keep follow-up visit with Suzann Riddle, NP on 5/8.         Signed, Lonell Rives. Alois Mincer, DNP, NP-C

## 2023-09-25 ENCOUNTER — Other Ambulatory Visit: Payer: Self-pay

## 2023-09-25 DIAGNOSIS — I480 Paroxysmal atrial fibrillation: Secondary | ICD-10-CM

## 2023-09-26 LAB — BASIC METABOLIC PANEL WITH GFR
BUN/Creatinine Ratio: 15 (ref 10–24)
BUN: 13 mg/dL (ref 8–27)
CO2: 19 mmol/L — ABNORMAL LOW (ref 20–29)
Calcium: 9.4 mg/dL (ref 8.6–10.2)
Chloride: 105 mmol/L (ref 96–106)
Creatinine, Ser: 0.88 mg/dL (ref 0.76–1.27)
Glucose: 95 mg/dL (ref 70–99)
Potassium: 4.8 mmol/L (ref 3.5–5.2)
Sodium: 140 mmol/L (ref 134–144)
eGFR: 89 mL/min/{1.73_m2} (ref >59)

## 2023-09-30 MED ORDER — CARVEDILOL 6.25 MG PO TABS: 6.2500 mg | ORAL_TABLET | Freq: Two times a day (BID) | ORAL | 3 refills | Status: DC

## 2023-10-02 NOTE — Progress Notes (Unsigned)
 Electrophysiology Clinic Note    Date:  10/03/2023  Patient ID:  Curtis Carroll, Curtis Carroll 28-Sep-1945, MRN [id number] PCP:  Reba Camper, MD  Cardiologist:  None Alvenia Aus, End, and [facility] consulted inpatient) Electrophysiologist: None   Discussed the use of AI scribe software for clinical note transcription with the patient, who gave verbal consent to proceed.   Patient Profile    Chief Complaint: Afib follow-up  History of Present Illness: Curtis Carroll is a 78 y.o. male with PMH notable for newly diagnosed AFib, HTN, hypothyroid, h/o GI Bleed; seen today for routine follow-up.   He was scheduled for outpatient colonoscopy and found to be in afib w RVR on 3/17 in pre-procedural area. He was initially started on hep gtt, transitioned to eliquis  at discharge. Rates controlled with lopressor  50mg  BID.  I saw him 07/2023 where he continued to be in AFib. He was having fatigue, and planned for DCCV once on eliquis  for 4 weeks. He is now s/p DCCV 4/22.  After procedure, he called our clinic with concerns for increased SOB with dizziness. He saw NP [place], c/o heart rates in 40-50s. She reduced metop to 25mg  BID and added lasix  x 3 days.  He messaged clinic after visit that his BP was staying elevated, so BB adjusted from metop > coreg.    On follow-up today, he continues to have high BP readings on coreg. He brings in a BP and pulse log where pulse is always 50-70s. BP highly variable 120-190 systolic, commonly 150-160.   He is confident that he has not had any Aflutter episodes since his DCCV, but was asymptomatic prior to DCCV. He has had intermittent DOE where he has to rest often with activities that he used to easily complete. He continues to take eliquis  BID, does have bruising to forearms. No significant bleeding via GI/GU.    Of note, his mother, brother, and daughter all have afib.     Arrhythmia/Device History No specialty comments available.    ROS:  Please  see the history of present illness. All other systems are reviewed and otherwise negative.    Physical Exam    VS:  BP 112/62 (BP Location: Left Arm, Patient Position: Sitting, Cuff Size: Normal)   Pulse (!) 57   Ht 5\' 11"  (1.803 m)   Wt 214 lb 6.4 oz (97.3 kg)   SpO2 93%   BMI 29.90 kg/m  BMI: Body mass index is 29.9 kg/m.  Wt Readings from Last 3 Encounters:  10/03/23 214 lb 6.4 oz (97.3 kg)  09/20/23 218 lb 9.6 oz (99.2 kg)  09/17/23 210 lb (95.3 kg)     GEN- The patient is well appearing, alert and oriented x 3 today.   Lungs- Clear to ausculation bilaterally, normal work of breathing.  Heart- Regular rate and rhythm, no murmurs, rubs or gallops Extremities- No peripheral edema, warm, dry    Studies Reviewed   Previous EP, cardiology notes.    EKG is ordered. Personal review of EKG from today shows:    EKG Interpretation Date/Time:  Thursday Oct 03 2023 10:06:33 EDT Ventricular Rate:  57 PR Interval:  170 QRS Duration:  82 QT Interval:  428 QTC Calculation: 416 R Axis:   -17  Text Interpretation: Sinus bradycardia Nonspecific T wave abnormality Confirmed by Trula Frede [phone]) on 10/03/2023 10:09:20 AM    TTE, 08/12/2023  1. Left ventricular ejection fraction, by estimation, is 50 to 55%. The left ventricle has low normal  function. Left ventricular endocardial border not optimally defined to evaluate regional wall motion. Left ventricular diastolic parameters are indeterminate.   2. Right ventricular systolic function is normal. The right ventricular size is normal. Tricuspid regurgitation signal is inadequate for assessing PA pressure.   3. The mitral valve is normal in structure. No evidence of mitral valve regurgitation. No evidence of mitral stenosis.   4. The aortic valve is normal in structure. Aortic valve regurgitation is not visualized. No aortic stenosis is present.    Assessment and Plan     #) parox AFib #) aflutter #) bradycardia #) DOE Recent  episode identified prior to colonoscopy after completing a difficult prep He was asymptomatic, s/p DCCV with conversion to sinus brady Historically asymptomatic of arrhyhtmia Update 2 week zio to confirm no AFib Will plan to reduce coreg to 3.125 BID for bradycardia   #) Hypercoag d/t parox afib CHA2DS2-VASc Score = at least 3 [CHF History: 0, HTN History: 1, Diabetes History: 0, Stroke History: 0, Vascular Disease History: 0, Age Score: 2, Gender Score: 0].  Therefore, the patient's annual risk of stroke is 3.2 %.    Stroke ppx - 5mg  eliquis  BID, appropriately dosed No bleeding concerns  #) HTN Significantly elevated by home readings Reduce coreg as above Restart previous 10mg  lisinopril           Current medicines are reviewed at length with the patient today.   The patient has concerns regarding his medicines.  The following changes were made today:   REDUCE coreg to 3.125mg  BID START 10mg  lisinopril   Labs/ tests ordered today include:  Orders Placed This Encounter  Procedures   LONG TERM MONITOR (3-14 DAYS)   EKG 12-Lead     Disposition: Follow up with EP APP in 3 months sooner if needed     Signed, Adaline Holly, NP  10/03/23  4:44 PM  Electrophysiology [facility]

## 2023-10-03 ENCOUNTER — Ambulatory Visit: Attending: Cardiology | Admitting: Cardiology

## 2023-10-03 ENCOUNTER — Ambulatory Visit

## 2023-10-03 VITALS — BP 112/62 | HR 57 | Ht 71.0 in | Wt 214.4 lb

## 2023-10-03 DIAGNOSIS — R0609 Other forms of dyspnea: Secondary | ICD-10-CM | POA: Diagnosis present

## 2023-10-03 DIAGNOSIS — I10 Essential (primary) hypertension: Secondary | ICD-10-CM | POA: Diagnosis present

## 2023-10-03 DIAGNOSIS — R001 Bradycardia, unspecified: Secondary | ICD-10-CM | POA: Diagnosis present

## 2023-10-03 DIAGNOSIS — I4819 Other persistent atrial fibrillation: Secondary | ICD-10-CM

## 2023-10-03 DIAGNOSIS — D6869 Other thrombophilia: Secondary | ICD-10-CM | POA: Insufficient documentation

## 2023-10-03 MED ORDER — CARVEDILOL 3.125 MG PO TABS: 3.1250 mg | ORAL_TABLET | Freq: Two times a day (BID) | ORAL | 3 refills | Status: DC

## 2023-10-03 MED ORDER — LISINOPRIL 10 MG PO TABS: 10.0000 mg | ORAL_TABLET | Freq: Every day | ORAL | 3 refills | Status: AC

## 2023-10-03 NOTE — Patient Instructions (Signed)
 Medication Instructions:  Decrease Carvedilol to 3.125 mg twice daily  RESTART Lisinopril    *If you need a refill on your cardiac medications before your next appointment, please call your pharmacy*  Testing/Procedures:  ZIO XT- Long Term Monitor Instructions  Your physician has requested you wear a ZIO patch monitor for 14 days.  This is a single patch monitor. [employer] supplies one patch monitor per enrollment. Additional stickers are not available. Please do not apply patch if you will be having a Nuclear Stress Test,  Echocardiogram, Cardiac CT, MRI, or Chest Xray during the period you would be wearing the  monitor. The patch cannot be worn during these tests. You cannot remove and re-apply the  ZIO XT patch monitor.  Your ZIO patch monitor will be mailed 3 day USPS to your address on file. It may take 3-5 days  to receive your monitor after you have been enrolled.  Once you have received your monitor, please review the enclosed instructions. Your monitor  has already been registered assigning a specific monitor serial # to you.  Billing and Patient Assistance Program Information  We have supplied [employer] with any of your insurance information on file for billing purposes. [employer] offers a sliding scale Patient Assistance Program for patients that do not have  insurance, or whose insurance does not completely cover the cost of the ZIO monitor.  You must apply for the Patient Assistance Program to qualify for this discounted rate.  To apply, please call [employer] at [phone], select option 4, select option 2, ask to apply for  Patient Assistance Program. Sanna Crystal will ask your household income, and how many people  are in your household. They will quote your out-of-pocket cost based on that information.  [employer] will also be able to set up a 32-month, interest-free payment plan if needed.  Applying the monitor   Shave hair from upper left chest.  Hold abrader disc by orange tab.  Rub abrader in 40 strokes over the upper left chest as  indicated in your monitor instructions.  Clean area with 4 enclosed alcohol pads. Let dry.  Apply patch as indicated in monitor instructions. Patch will be placed under collarbone on left  side of chest with arrow pointing upward.  Rub patch adhesive wings for 2 minutes. Remove white label marked "1". Remove the white  label marked "2". Rub patch adhesive wings for 2 additional minutes.  While looking in a mirror, press and release button in center of patch. A small green light will  flash 3-4 times. This will be your only indicator that the monitor has been turned on.  Do not shower for the first 24 hours. You may shower after the first 24 hours.  Press the button if you feel a symptom. You will hear a small click. Record Date, Time and  Symptom in the Patient Logbook.  When you are ready to remove the patch, follow instructions on the last 2 pages of Patient  Logbook. Stick patch monitor onto the last page of Patient Logbook.  Place Patient Logbook in the blue and white box. Use locking tab on box and tape box closed  securely. The blue and white box has prepaid postage on it. Please place it in the mailbox as  soon as possible. Your physician should have your test results approximately 7 days after the  monitor has been mailed back to [facility].  Call [facility] Customer Care at [phone] if you have questions regarding  your ZIO XT patch  monitor. Call them immediately if you see an orange light blinking on your  monitor.  If your monitor falls off in less than 4 days, contact our Monitor department at [phone].  If your monitor becomes loose or falls off after 4 days call [employer] at [phone] for  suggestions on securing your monitor   Follow-Up: At [facility], you and your health needs are our priority.  As part of our continuing mission to provide you with exceptional heart care, our providers are  all part of one team.  This team includes your primary Cardiologist (physician) and Advanced Practice Providers or APPs (Physician Assistants and Nurse Practitioners) who all work together to provide you with the care you need, when you need it.  Your next appointment:   3 month(s)  Provider:   Suzann Riddle, NP    We recommend signing up for the patient portal called "MyChart".  Sign up information is provided on this After Visit Summary.  MyChart is used to connect with patients for Virtual Visits (Telemedicine).  Patients are able to view lab/test results, encounter notes, upcoming appointments, etc.  Non-urgent messages can be sent to your provider as well.   To learn more about what you can do with MyChart, go to [email or web address].

## 2023-10-11 ENCOUNTER — Ambulatory Visit: Admitting: Student

## 2023-11-07 DIAGNOSIS — I4819 Other persistent atrial fibrillation: Secondary | ICD-10-CM | POA: Diagnosis not present

## 2023-11-09 ENCOUNTER — Ambulatory Visit: Payer: Self-pay | Admitting: Cardiology

## 2023-11-11 ENCOUNTER — Telehealth: Payer: Self-pay | Admitting: Cardiology

## 2023-11-11 ENCOUNTER — Other Ambulatory Visit: Payer: Self-pay

## 2023-11-11 MED ORDER — APIXABAN 5 MG PO TABS: 5.0000 mg | ORAL_TABLET | Freq: Two times a day (BID) | ORAL | 1 refills | Status: DC

## 2023-11-11 NOTE — Telephone Encounter (Signed)
 Prescription refill request for Eliquis  received. Indication:afib Last office visit:5/25 Scr:0.88  4/25 Age: 78 Weight:97.3  kg  Prescription refilled

## 2023-11-11 NOTE — Telephone Encounter (Signed)
*  STAT* If patient is at the pharmacy, call can be transferred to refill team.   1. Which medications need to be refilled? (please list name of each medication and dose if known)   apixaban (ELIQUIS) 5 MG TABS tablet     4. Which pharmacy/location (including street and city if local pharmacy) is medication to be sent to? [id number] - [place], [place] - [address]     5. Do they need a 30 day or 90 day supply? 90

## 2023-12-16 ENCOUNTER — Other Ambulatory Visit: Payer: Self-pay | Admitting: Student

## 2024-01-11 NOTE — Progress Notes (Unsigned)
 Electrophysiology Clinic Note    Date:  01/13/2024  Patient ID:  Curtis Carroll, Curtis Carroll 1945-09-24, MRN [id number] PCP:  Trinidad Cassondra BROCKS, MD  Cardiologist:  None Marsa, End, and Hodges consulted inpatient) Electrophysiologist: Fonda Kitty, MD (though has not met)   Discussed the use of AI scribe software for clinical note transcription with the patient, who gave verbal consent to proceed.   Patient Profile    Chief Complaint: Afib follow-up  History of Present Illness: Curtis Carroll is a 78 y.o. male with PMH notable for persis AFib, HTN, hypothyroid, h/o GI Bleed; seen today for routine follow-up.   AFib first diagnosed 07/2023 in pre-op for colonoscopy. He was asymptomatic of arrhythmia, rates controlled with lopressor . He is s/p DCCV with conversion to sinus bradycardia.  I last saw him 09/2023 where he continued to have bradycardia with elevated BP and intermittent SOB. Zio updated to confirm that he was maintaining sinus rhythm.   On follow-up today, he was recently diagnosed with elevated LFTs by PCP, planning to repeat lab work this week to confirm. His PCP recently started him on montelukast which has improved his congestion and SOB. He  monitors his heart rhythm multiple times per day using a KardiaMobile device. He reports no recent episodes of AFib but experiences a low heart rate. He experiences occasional dizziness and lightheadedness, particularly when his heart rate drops to the low 40s (confirmed by [facility]). He is on a low dose of Coreg . Physical activity, such as using a treadmill, helps alleviate these symptoms.  He continues to take eliquis  BID, no missed doses. He does have occasional epistaxis and bruising. No bleeding through GI/GU systems.   BP at home usually runs 120 systolic.     Of note, his mother, brother, and daughter all have afib.     Arrhythmia/Device History No specialty comments available.    ROS:  Please see the history of  present illness. All other systems are reviewed and otherwise negative.    Physical Exam    VS:  BP 124/62 (BP Location: Left Arm, Patient Position: Sitting, Cuff Size: Normal)   Pulse 65   Ht 5' 10 (1.778 m)   Wt 212 lb (96.2 kg)   SpO2 95%   BMI 30.42 kg/m  BMI: Body mass index is 30.42 kg/m.  Wt Readings from Last 3 Encounters:  01/13/24 212 lb (96.2 kg)  10/03/23 214 lb 6.4 oz (97.3 kg)  09/20/23 218 lb 9.6 oz (99.2 kg)     GEN- The patient is well appearing, alert and oriented x 3 today.   Lungs- Clear to ausculation bilaterally, normal work of breathing.  Heart- Regular rate and rhythm, no murmurs, rubs or gallops Extremities- No peripheral edema, warm, dry    Studies Reviewed   Previous EP, cardiology notes.    EKG is not ordered. Personal review of EKG from 10/03/2023 shows:  Sinus at 57       Long term monitor, 11/05/2023 HR 38 to 140, average 57. 14 nonsustained SVT, longest 12 seconds. Rare supraventricular ectopy. Frequent ventricular ectopy, 5%. No sustained arrhythmias. No atrial fibrillation.  TTE, 08/12/2023  1. Left ventricular ejection fraction, by estimation, is 50 to 55%. The left ventricle has low normal function. Left ventricular endocardial border not optimally defined to evaluate regional wall motion. Left ventricular diastolic parameters are indeterminate.   2. Right ventricular systolic function is normal. The right ventricular size is normal. Tricuspid regurgitation signal is inadequate for assessing PA  pressure.   3. The mitral valve is normal in structure. No evidence of mitral valve regurgitation. No evidence of mitral stenosis.   4. The aortic valve is normal in structure. Aortic valve regurgitation is not visualized. No aortic stenosis is present.    Assessment and Plan     #) persis AFib #) aflutter #) bradycardia #) Hypercoag d/t parox afib AF diagnosed prior to colonoscopy after completing a difficult prep He was asymptomatic,  s/p DCCV with conversion to sinus brady and has maintained sinus rhythm since that time.  He uses kardia mobile device multiple times per day  Historically asymptomatic of arrhythmia He continues to have bradycardia, will stop coreg   CHA2DS2-VASc Score = at least 3 (HTN, age x 2) Stroke ppx - 5mg  eliquis  BID, appropriately dosed Has intermittent epistaxis and bruising to forearms (pre-dates eliquis  usage) Long discussion regarding whether to continue eliquis  after single AF episode with ongoing monitoring using kardia mobile. Discussed no monitoring strategy is error-free. At this time, he will continue eliquis  but may stop in future. I've asked him to notify office if he does decide to stop   #) HTN Well-controlled at home Continue 10mg  lisinopril  Continue to monitor with stopping coreg  as above          Current medicines are reviewed at length with the patient today.   The patient has concerns regarding his medicines.  The following changes were made today:   STOP coreg  to 3.125mg  BID  Labs/ tests ordered today include:  Orders Placed This Encounter  Procedures   CBC     Disposition: Follow up with Dr. Kennyth or EP APP in 6 months sooner if needed     Signed, Chantal Needle, NP  01/13/24  12:50 PM  Electrophysiology [facility]

## 2024-01-13 ENCOUNTER — Ambulatory Visit: Attending: Cardiology | Admitting: Cardiology

## 2024-01-13 VITALS — BP 124/62 | HR 65 | Ht 70.0 in | Wt 212.0 lb

## 2024-01-13 DIAGNOSIS — I4819 Other persistent atrial fibrillation: Secondary | ICD-10-CM | POA: Insufficient documentation

## 2024-01-13 DIAGNOSIS — R001 Bradycardia, unspecified: Secondary | ICD-10-CM | POA: Insufficient documentation

## 2024-01-13 DIAGNOSIS — I10 Essential (primary) hypertension: Secondary | ICD-10-CM | POA: Diagnosis present

## 2024-01-13 DIAGNOSIS — D6869 Other thrombophilia: Secondary | ICD-10-CM | POA: Diagnosis present

## 2024-01-13 NOTE — Patient Instructions (Signed)
 Medication Instructions:  STOP Coreg   *If you need a refill on your cardiac medications before your next appointment, please call your pharmacy*  Lab Work: CBC- when you have lab work in [place].   If you have labs (blood work) drawn today and your tests are completely normal, you will receive your results only by: MyChart Message (if you have MyChart) OR A paper copy in the mail If you have any lab test that is abnormal or we need to change your treatment, we will call you to review the results.  Follow-Up: At [facility], you and your health needs are our priority.  As part of our continuing mission to provide you with exceptional heart care, our providers are all part of one team.  This team includes your primary Cardiologist (physician) and Advanced Practice Providers or APPs (Physician Assistants and Nurse Practitioners) who all work together to provide you with the care you need, when you need it.  Your next appointment:   6 month(s)  Provider:   Fonda Kitty, MD    We recommend signing up for the patient portal called MyChart.  Sign up information is provided on this After Visit Summary.  MyChart is used to connect with patients for Virtual Visits (Telemedicine).  Patients are able to view lab/test results, encounter notes, upcoming appointments, etc.  Non-urgent messages can be sent to your provider as well.   To learn more about what you can do with MyChart, go to [email or web address].

## 2024-01-20 ENCOUNTER — Other Ambulatory Visit: Payer: Self-pay | Admitting: *Deleted

## 2024-03-06 ENCOUNTER — Other Ambulatory Visit: Payer: Self-pay | Admitting: Gastroenterology

## 2024-03-06 DIAGNOSIS — R748 Abnormal levels of other serum enzymes: Secondary | ICD-10-CM

## 2024-03-11 ENCOUNTER — Ambulatory Visit
Admission: RE | Admit: 2024-03-11 | Discharge: 2024-03-11 | Disposition: A | Discharge status: Home / Self Care | Source: Ambulatory Visit | Attending: Gastroenterology | Admitting: Gastroenterology

## 2024-03-11 DIAGNOSIS — R748 Abnormal levels of other serum enzymes: Secondary | ICD-10-CM | POA: Diagnosis present

## 2024-06-09 ENCOUNTER — Ambulatory Visit: Admitting: Cardiology

## 2024-06-09 NOTE — Progress Notes (Unsigned)
"  ° °  °  Electrophysiology Clinic Note    Date:  06/09/2024  Patient ID:  Curtis Carroll, Curtis Carroll 08-Nov-1945, MRN [id number] PCP:  Curtis Cassondra BROCKS, MD  Cardiologist:  None  Electrophysiologist:  Curtis Kitty, MD    ***refresh  Discussed the use of AI scribe software for clinical note transcription with the patient, who gave verbal consent to proceed.   Patient Profile    Chief Complaint: ***  History of Present Illness: Curtis Carroll is a 79 y.o. male with PMH notable for ***; seen today for Curtis Kitty, MD for {VISITTYPE:28148}     Arrhythmia/Device History No specialty comments available.    ROS:  Please see the history of present illness. All other systems are reviewed and otherwise negative.    Physical Exam    VS:  There were no vitals taken for this visit. BMI: There is no height or weight on file to calculate BMI.           Wt Readings from Last 3 Encounters:  01/13/24 212 lb (96.2 kg)  10/03/23 214 lb 6.4 oz (97.3 kg)  09/20/23 218 lb 9.6 oz (99.2 kg)     ***  GEN- The patient is well appearing, alert and oriented x 3 today.   Lungs- Clear to ausculation bilaterally, normal work of breathing.  Heart- {Blank single:19197::Regular,Irregularly irregular} rate and rhythm, ***no murmurs, rubs or gallops Extremities- {EDEMA LEVEL:28147::No} peripheral edema, warm, dry Skin-  *** device pocket well-healed, no tethering   *** Brief check performed without iterative lead testing/measurements   Device interrogation done today and reviewed by myself:  Battery *** Lead thresholds, impedence, sensing stable *** *** episodes *** changes made today   Studies Reviewed   Previous EP, cardiology notes.    EKG {ACTION; IS/IS WNU:78978602} ordered. Personal review of EKG from {Blank single:19197::today,***} shows:  ***             Assessment and Plan     #) ***   #) ***   {Are you ordering a CV Procedure (e.g. stress test,  cath, DCCV, TEE, etc)?   Press F2        :[id number]}   Current medicines are reviewed at length with the patient today.   The patient {ACTIONS; HAS/DOES NOT HAVE:19233} concerns regarding his medicines.  The following changes were made today:  {NONE DEFAULTED:18576}  Labs/ tests ordered today include: *** No orders of the defined types were placed in this encounter.    Disposition: Follow up with {EPMDS:28135::EP Team} or EP APP {EPFOLLOW UP:28173}   Signed, Curtis Needle, NP  06/09/2024  8:46 AM  Electrophysiology [facility] "

## 2024-07-27 ENCOUNTER — Ambulatory Visit: Admit: 2024-07-27

## 2024-07-27 SURGERY — COLONOSCOPY
Anesthesia: General
